# Patient Record
Sex: Male | Born: 1935 | Race: White | Hispanic: No | Marital: Single | Smoking: Former smoker
Health system: Southern US, Community
[De-identification: ages and names within clinical notes are randomized; demographics above are authoritative.]

## PROBLEM LIST (undated history)

## (undated) DIAGNOSIS — K219 Gastro-esophageal reflux disease without esophagitis: Secondary | ICD-10-CM

## (undated) DIAGNOSIS — Z87442 Personal history of urinary calculi: Secondary | ICD-10-CM

## (undated) DIAGNOSIS — F329 Major depressive disorder, single episode, unspecified: Secondary | ICD-10-CM

## (undated) DIAGNOSIS — I10 Essential (primary) hypertension: Secondary | ICD-10-CM

## (undated) DIAGNOSIS — H10409 Unspecified chronic conjunctivitis, unspecified eye: Secondary | ICD-10-CM

## (undated) DIAGNOSIS — M1990 Unspecified osteoarthritis, unspecified site: Secondary | ICD-10-CM

## (undated) DIAGNOSIS — F32A Depression, unspecified: Secondary | ICD-10-CM

## (undated) HISTORY — PX: KIDNEY STONE SURGERY: SHX686

## (undated) HISTORY — PX: COLONOSCOPY: SHX174

## (undated) HISTORY — DX: Unspecified osteoarthritis, unspecified site: M19.90

---
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
## 1999-05-10 ENCOUNTER — Ambulatory Visit (HOSPITAL_COMMUNITY): Admission: RE | Admit: 1999-05-10 | Discharge: 1999-05-10 | Payer: Self-pay | Admitting: Urology

## 1999-05-10 ENCOUNTER — Encounter: Payer: Self-pay | Admitting: Urology

## 1999-10-01 ENCOUNTER — Encounter (INDEPENDENT_AMBULATORY_CARE_PROVIDER_SITE_OTHER): Payer: Self-pay

## 1999-10-01 ENCOUNTER — Ambulatory Visit (HOSPITAL_COMMUNITY): Admission: RE | Admit: 1999-10-01 | Discharge: 1999-10-01 | Payer: Self-pay | Admitting: *Deleted

## 2002-02-13 ENCOUNTER — Encounter: Payer: Self-pay | Admitting: Emergency Medicine

## 2002-02-14 ENCOUNTER — Inpatient Hospital Stay (HOSPITAL_COMMUNITY): Admission: EM | Admit: 2002-02-14 | Discharge: 2002-02-14 | Payer: Self-pay | Admitting: Emergency Medicine

## 2004-05-15 ENCOUNTER — Encounter (INDEPENDENT_AMBULATORY_CARE_PROVIDER_SITE_OTHER): Payer: Self-pay | Admitting: *Deleted

## 2004-05-15 ENCOUNTER — Ambulatory Visit (HOSPITAL_COMMUNITY): Admission: RE | Admit: 2004-05-15 | Discharge: 2004-05-15 | Payer: Self-pay | Admitting: *Deleted

## 2004-09-22 ENCOUNTER — Emergency Department (HOSPITAL_COMMUNITY): Admission: EM | Admit: 2004-09-22 | Discharge: 2004-09-22 | Payer: Self-pay | Admitting: Emergency Medicine

## 2005-08-21 ENCOUNTER — Encounter: Admission: RE | Admit: 2005-08-21 | Discharge: 2005-08-21 | Payer: Self-pay | Admitting: Internal Medicine

## 2005-09-23 ENCOUNTER — Ambulatory Visit (HOSPITAL_COMMUNITY): Admission: RE | Admit: 2005-09-23 | Discharge: 2005-09-23 | Payer: Self-pay | Admitting: Urology

## 2005-10-01 ENCOUNTER — Ambulatory Visit (HOSPITAL_BASED_OUTPATIENT_CLINIC_OR_DEPARTMENT_OTHER): Admission: RE | Admit: 2005-10-01 | Discharge: 2005-10-01 | Payer: Self-pay | Admitting: Urology

## 2006-06-18 ENCOUNTER — Encounter: Admission: RE | Admit: 2006-06-18 | Discharge: 2006-06-18 | Payer: Self-pay | Admitting: Internal Medicine

## 2006-08-04 ENCOUNTER — Ambulatory Visit (HOSPITAL_COMMUNITY): Admission: RE | Admit: 2006-08-04 | Discharge: 2006-08-04 | Payer: Self-pay | Admitting: Urology

## 2006-09-15 ENCOUNTER — Emergency Department (HOSPITAL_COMMUNITY): Admission: EM | Admit: 2006-09-15 | Discharge: 2006-09-15 | Payer: Self-pay | Admitting: Emergency Medicine

## 2008-05-09 ENCOUNTER — Emergency Department (HOSPITAL_COMMUNITY): Admission: EM | Admit: 2008-05-09 | Discharge: 2008-05-09 | Payer: Self-pay | Admitting: Emergency Medicine

## 2008-08-05 ENCOUNTER — Ambulatory Visit (HOSPITAL_BASED_OUTPATIENT_CLINIC_OR_DEPARTMENT_OTHER): Admission: RE | Admit: 2008-08-05 | Discharge: 2008-08-05 | Payer: Self-pay | Admitting: Urology

## 2008-11-03 ENCOUNTER — Encounter: Admission: RE | Admit: 2008-11-03 | Discharge: 2008-11-03 | Payer: Self-pay | Admitting: Internal Medicine

## 2008-11-10 ENCOUNTER — Encounter: Admission: RE | Admit: 2008-11-10 | Discharge: 2009-02-08 | Payer: Self-pay | Admitting: Internal Medicine

## 2009-12-25 ENCOUNTER — Encounter: Admission: RE | Admit: 2009-12-25 | Discharge: 2009-12-25 | Payer: Self-pay | Admitting: Internal Medicine

## 2010-09-25 LAB — POCT I-STAT, CHEM 8
Chloride: 103 mEq/L (ref 96–112)
Creatinine, Ser: 0.8 mg/dL (ref 0.4–1.5)
HCT: 47 % (ref 39.0–52.0)
Potassium: 4.1 mEq/L (ref 3.5–5.1)
Sodium: 138 mEq/L (ref 135–145)

## 2010-10-23 NOTE — Op Note (Signed)
NAMEJERRON, Roger Saunders NO.:  [id number]   MEDICAL RECORD NO.:  [id number]          PATIENT TYPE:  AMB   LOCATION:  NESC                         FACILITY:  [facility]   PHYSICIAN:  Maretta Bees. Vonita Moss, M.D.DATE OF BIRTH:  1932/01/11   DATE OF PROCEDURE:  08/05/2008  DATE OF DISCHARGE:                               OPERATIVE REPORT   PREOPERATIVE DIAGNOSIS:  Bladder stone.   POSTOPERATIVE DIAGNOSIS:  Bladder stone.   PROCEDURE:  Cystoscopy and electrohydraulic lithotripsy of bladder  stone.   SURGEON:  Dr. Larey Dresser.   ANESTHESIA:  General.   INDICATIONS:  This gentleman recently passed a left ureteral calculus  that measured 6 mm in size.  He never did pass it from the bladder.  He  came in the other day complaining about dysuria and intermittent  straining to void, and the KUB demonstrated that the stone has remained  in the bladder after several weeks.  Because of the symptomatic nature  of the stone, he was elected to have cysto and EHL.   PROCEDURE:  The patient was brought to the operating room and placed in  lithotomy position.  External genitalia were prepped and draped in usual  fashion.  He was cystoscoped, and the anterior urethra was normal.  He  had partial prostatic obstruction with actually fairly small lateral  lobes but a high bladder neck.  The bladder had trabeculation.  He has  some increased erythema felt to be secondary to this golden yellow  irregular stone in the bladder.  It was too large to pass through the  cystoscope in one piece, so I used the EHL and broke it into several  small fragments that were then washed out of the bladder.  At this  point, all the stone fragments were removed.  There was no bleeding, and  he had essentially no blood loss, and the bladder was drained.  The  cystoscope removed, and he was taken to recovery room in good condition,  having tolerated the procedure well.      Maretta Bees. Vonita Moss, M.D.  Electronically Signed     LJP/MEDQ  D:  08/05/2008  T:  08/05/2008  Job:  [id number]

## 2010-10-26 NOTE — Discharge Summary (Signed)
   NAME:  Roger Saunders, Roger Saunders                          ACCOUNT NO.:  [id number]   MEDICAL RECORD NO.:  [id number]                   PATIENT TYPE:  INP   LOCATION:  0355                                 FACILITY:  [facility]   PHYSICIAN:  Juline Patch, M.D.                  DATE OF BIRTH:  1932-02-05   DATE OF ADMISSION:  02/13/2002  DATE OF DISCHARGE:  02/14/2002                                 DISCHARGE SUMMARY   HISTORY OF PRESENT ILLNESS:  The patient is a 77 year old white male with  multiple risk factors for coronary artery disease.  He came in complaining  of epigastric and central chest pain that lasted for six hours, which was  relieved by nitroglycerin in the emergency room.  He was admitted for chest  pain rule out.   HOSPITAL COURSE:  His hospital course involving three serial CRPs that were  negative, EKGs that show no ST changes.  He did have an elevated systolic  blood pressure in the 190s but it did go down on its own.  He remained chest  pain-free throughout the hospital course.  He was titrated off the  nitroglycerin drip from the emergency room and remained chest pain free for  the rest of his hospital course.   DISCHARGE DIAGNOSES:  1. Atypical chest pain.  2. Esophageal spasm/gastroesophageal reflux disease.  3. Hypertension.  4. Hyperlipidemia.  5. Osteoarthritis.   PAST MEDICAL HISTORY:  The patient had a Cardiolite stress test one year ago  that was negative.   MEDICATIONS ON DISCHARGE:  1. Monopril 10 mg once a day.  2. Lipitor 10 mg once a day.  3. Aspirin 81 mg once a day.  4. Glucosamine 1000 mg once a day.  5. Protonix 40 mg once a day.   DISPOSITION:  Home.   STATUS:  Stable.    FOLLOWUP:  The patient will follow up with Dr. Lendell Caprice in one week's time.  He was told if he has chest pain that recurs that is not relieved with  Protonix he needs to call or directly go to the emergency room as we cannot  definitely say that this is not cardiac until he is  further followed up.                                               Juline Patch, M.D.    RP/MEDQ  D:  02/14/2002  T:  02/14/2002  Job:  [id number]   cc:   Janae Bridgeman. Eloise Harman., M.D.  Fax: [phone]

## 2010-10-26 NOTE — H&P (Signed)
NAME:  Roger Saunders, LEGATE                          ACCOUNT NO.:  [id number]   MEDICAL RECORD NO.:  [id number]                   PATIENT TYPE:  EMS   LOCATION:  ED                                   FACILITY:  [facility]   PHYSICIAN:  Juline Patch, M.D.                  DATE OF BIRTH:  1932-03-23   DATE OF ADMISSION:  02/13/2002  DATE OF DISCHARGE:                                HISTORY & PHYSICAL   HISTORY OF PRESENT ILLNESS:  Mr. Roger Saunders is a 77 year old white male with  multiple risk factors for coronary artery such as family history, smoker,  hypertension, hyperlipidemia, who came in complaining of indigestion like  pain in the central chest area at 4:00 p.m. while lying on the couch. The  patient took Tums with no relief and waited it out until after dinner, when  the pain increased to a six out of ten. He took Tums with still no relief  and decided to come to the emergency room. Involves having a treadmill as  well as Cardiolite stress test at [facility] approximately one year ago  for complaints of numbness in the arms. The stress test was negative. He  exercises regularly and is outside working in the yard and has never had  chest pain or shortness of breath during exertion. In the emergency room, he  was given Nitroglycerin times three which improved his chest pain. His blood  pressure was elevated in the systolic of 190's. He had no EKG changes and  was started on Nitro drip. The emergency room would like for the patient to  be admitted.   PAST MEDICAL HISTORY:  The patient is followed by Dr. Lendell Caprice who had  recently seen in approximately one month ago, where he had normal  cholesterol secondary to being on Lipitor. The patient has a history of  hypertension and is on Monopril. Also has osteoarthritis and kidney stones.   MEDICATIONS:  1. Monopril 10 mg once a day.  2. Lipitor 10 mg once a day.  3. Aspirin.  4. Glucosamine 1,000 mg once a day.   ALLERGIES:  No known drug  allergies.   SOCIAL HISTORY:  He smokes cigars, five to six per day. Drinks three to four  glasses of wine a week.   FAMILY HISTORY:  Brother had early coronary artery disease, status post  coronary artery bypass grafting.   REVIEW OF SYSTEMS:  Denies any weight loss, fever, chills, cough, shortness  of breath, nausea and vomiting, headache, rash, blood in stools, black  stools, epigastric pain. Does admit to having indigestion once in a while,  relieved by Tums. All other review of systems negative.   PHYSICAL EXAMINATION:  GENERAL: The patient is comfortable in bed with  Nitroglycerin drip running.  VITAL SIGNS: Blood pressure 132/70, pulse 72 after being on the Nitro drip.  The systolic was higher when he first came  in.  HEENT: Pupils are equal, round, and reactive to light and accommodation.  Throat clear. Mucous membranes wet.  NECK: No lymphadenopathy. Supple.  LUNGS: Clear to auscultation and percussion.  HEART: S1 and S2.  ABDOMEN: Soft and nontender.  EXTREMITIES: No edema.  NEURO: Mood is appropriate.   DIAGNOSTIC STUDIES:  Chest x-ray shows no disease. EKG shows sinus rhythm  and no acute ST changes.   LABORATORY DATA:  Negative CK, MB, and troponin. This is after six hours of  chest pain. Other lab workup is negative.   ASSESSMENT/PLAN:  The patient is a 77 year old white male with multiple risk  factors for coronary artery disease and negative Cardiolite stress test one  year ago, who comes in complaining of six hours of chest pain with no EKG  changes and negative troponin. He describes the pain as being an indigestion-  like pain but more severe in nature which warranted for him to go to the  emergency room. This was relieved by Nitroglycerin. He is going to be  admitted to telemetry and he will be ruled out with enzymes. Will continue  the Nitro drip. My suspicion is that this is not cardiac in nature,  considering his history and examination as well as a  negative troponin after  six hours of chest pain. I will hold off on anticoagulation and just  continue him on Nitroglycerin drip, aspirin, and Lipitor. Because of his  high blood pressure I will start him on low dose beta blockers.                                               Juline Patch, M.D.    RP/MEDQ  D:  02/14/2002  T:  02/14/2002  Job:  [id number]   cc:   Janae Bridgeman. Eloise Harman., M.D.  Fax: [phone]

## 2010-10-26 NOTE — Op Note (Signed)
Roger Saunders, Roger Saunders                ACCOUNT NO.:  [id number]   MEDICAL RECORD NO.:  [id number]          PATIENT TYPE:  AMB   LOCATION:  NESC                         FACILITY:  [facility]   PHYSICIAN:  Maretta Bees. Vonita Moss, M.D.DATE OF BIRTH:  Jun 11, 1931   DATE OF PROCEDURE:  10/01/2005  DATE OF DISCHARGE:                                 OPERATIVE REPORT   PREOPERATIVE DIAGNOSIS:  Bladder stone.   POSTOPERATIVE DIAGNOSIS:  Bladder stone.   PROCEDURE:  Cystoscopy and electrohydraulic lithotripsy of bladder stone.   SURGEON:  Maretta Bees. Vonita Moss, M.D.   ANESTHESIA:  General.   INDICATIONS:  This gentleman with a long history of stone disease had a 7 mm  stone in the left UVJ, and he was scheduled for ESL, but on the day of ESL,  the KUB showed the stone was in the bladder.  I put him on Flomax, and in  the last week, he has been unable to pass this stone spontaneously, so he is  brought to the OR today for cysto and EHL.   PROCEDURE:  The patient is brought to the operating room and placed in a  lithotomy position.  External genitalia were prepped and draped in the usual  fashion.  He was cystoscoped.  The anterior prostatic urethra was  unremarkable.  The bladder had minimal trabeculation.  No tumors or  inflammatory lesions.  He did have a golden, round stone found on x-ray.  It  was too big to come out the cystoscope, so he had the stone fragmented with  EHL under multiple small pieces, which then were washed out through the  cystoscope without problem.  At this point, the bladder was emptied, the  scope removed.  The patient was sent to the recovery room in good condition.  There was no blood loss.  He tolerated the procedure well.      Maretta Bees. Vonita Moss, M.D.  Electronically Signed     LJP/MEDQ  D:  10/01/2005  T:  10/01/2005  Job:  [id number]

## 2010-10-26 NOTE — Op Note (Signed)
NAMELOVE, MILBOURNE NO.:  [id number]   MEDICAL RECORD NO.:  [id number]          PATIENT TYPE:  AMB   LOCATION:  ENDO                         FACILITY:  [facility]   PHYSICIAN:  Georgiana Spinner, M.D.    DATE OF BIRTH:  09-08-1931   DATE OF PROCEDURE:  05/15/2004  DATE OF DISCHARGE:                                 OPERATIVE REPORT   PROCEDURE PERFORMED:  Colonoscopy.   ENDOSCOPIST:  Georgiana Spinner, M.D.   INDICATIONS FOR PROCEDURE:  Colon polyp.   ANESTHESIA:  Demerol 20 mg, Versed 0 mg.   DESCRIPTION OF PROCEDURE:  With the patient mildly sedated in the left  lateral decubitus position, the Olympus videoscopic colonoscope was inserted  in the rectum and passed under direct vision to the cecum, identified by the  ileocecal valve and appendiceal orifice, both of which were photographed.  From this point the colonoscope was slowly withdrawn taking circumferential  views of the colonic mucosa stopping in the sigmoid colon where moderate  degree of diverticula were noted at 30 cm from the anal verge at which  point, a small polyp was seen and removed using hot biopsy forceps technique  setting of 20/200 blended current.  The endoscope was straightened and  withdrawn all the way to the rectum which appeared normal on direct and  retroflex view.  The endoscope was straightened and withdrawn.  The  patient's vital signs and pulse oximeter remained stable.  The patient  tolerated the procedure well without apparent complications.   FINDINGS:  Diverticulosis of the sigmoid colon.  Small polyp at 30 cm from  the anal verge.  Otherwise unremarkable examination.   PLAN:  Await biopsy report.  The patient will call me for results and follow  up with me as an outpatient.       GMO/MEDQ  D:  05/15/2004  T:  05/15/2004  Job:  [id number]

## 2010-10-26 NOTE — Op Note (Signed)
[name], Roger Saunders NO.:  [id number]   MEDICAL RECORD NO.:  [id number]          PATIENT TYPE:  AMB   LOCATION:  ENDO                         FACILITY:  [facility]   PHYSICIAN:  Georgiana Spinner, M.D.    DATE OF BIRTH:  28-Dec-1931   DATE OF PROCEDURE:  05/15/2004  DATE OF DISCHARGE:                                 OPERATIVE REPORT   PROCEDURE PERFORMED:  Upper endoscopy with biopsy.   ENDOSCOPIST:  Georgiana Spinner, M.D.   INDICATIONS FOR PROCEDURE:  Gastroesophageal reflux disease.   ANESTHESIA:  Demerol 50 mg, Versed 5 mg.   DESCRIPTION OF PROCEDURE:  With the patient mildly sedated in the left  lateral decubitus position, the Olympus videoscopic endoscope was inserted  in the mouth and passed under direct vision through the esophagus into the  stomach.  The fundus, body, antrum, duodenal bulb and second portion of the  duodenum were visualized.  From this point, the endoscope was slowly  withdrawn taking circumferential views of the duodenal mucosa until the  endoscope was pulled back into the stomach and placed on retroflexion to  view the stomach from below.  The endoscope was then straightened and  withdrawn taking circumferential views of the remaining gastric and  esophageal mucosa, stopping in the distal esophagus to photograph and biopsy  around the perimeter of the squamocolumnar junction to rule out Barrett's.  The endoscope was withdrawn.  The patient's vital signs and pulse oximeter  remained stable.  The patient tolerated the procedure well without apparent  complications.   FINDINGS:  Unremarkable examination.   PLAN:  Await biopsy report.  The patient will call me for results and follow  up with me as an outpatient.  Proceed to colonoscopy as planned.       GMO/MEDQ  D:  05/15/2004  T:  05/15/2004  Job:  [id number]

## 2011-03-12 LAB — URINE MICROSCOPIC-ADD ON

## 2011-03-12 LAB — URINALYSIS, ROUTINE W REFLEX MICROSCOPIC
Nitrite: NEGATIVE
Specific Gravity, Urine: 1.014
pH: 7

## 2014-06-24 ENCOUNTER — Encounter: Payer: Self-pay | Admitting: Rehabilitative and Restorative Service Providers"

## 2014-07-04 ENCOUNTER — Ambulatory Visit: Payer: Self-pay | Admitting: Physical Therapy

## 2014-07-04 ENCOUNTER — Ambulatory Visit
Payer: Commercial Managed Care - HMO | Attending: Internal Medicine | Admitting: Rehabilitative and Restorative Service Providers"

## 2014-07-04 ENCOUNTER — Encounter: Payer: Self-pay | Admitting: Rehabilitative and Restorative Service Providers"

## 2014-07-04 DIAGNOSIS — H8110 Benign paroxysmal vertigo, unspecified ear: Secondary | ICD-10-CM | POA: Diagnosis not present

## 2014-07-04 NOTE — Therapy (Signed)
[facility] [address] [place], [place], [address] Phone: [phone]   Fax:  [phone]  Physical Therapy Evaluation  Patient Details  Name: Roger Saunders MRN: [id number] Date of Birth: 28-Apr-1932 Referring Provider:  Jani Gravel, MD  Encounter Date: 07/04/2014      PT End of Session - 07/04/14 1352    Visit Number 1  G code 1   Number of Visits 4   Date for PT Re-Evaluation 08/04/14   PT Start Time 1320   PT Stop Time 1345   PT Time Calculation (min) 25 min   Activity Tolerance Patient tolerated treatment well      Past Medical History  Diagnosis Date  . Arthritis     No past surgical history on file.  There were no vitals taken for this visit.  Visit Diagnosis:  BPPV (benign paroxysmal positional vertigo), unspecified laterality      Subjective Assessment - 07/04/14 1324    Symptoms The patient reports h/o vertigo and was treated at our clinic in the past.  He reports occasional conjunctivitis and noted when trying to use eye drops that the world was spinning.  He is noticing dizziness with getting up in the morning.  He did exercises that we prescribed in the past  and noted no improvement in 4-5 days, so stopped doing exercise.    Patient Stated Goals decrease dizziness   Currently in Pain? No/denies          [facility] PT Assessment - 07/04/14 1327    Assessment   Medical Diagnosis vertigo   Onset Date --  05/2014   Prior Therapy OP vestibular rehab x >5 years ago   Balance Screen   Has the patient fallen in the past 6 months No   Has the patient had a decrease in activity level because of a fear of falling?  Yes   Is the patient reluctant to leave their home because of a fear of falling?  No   Home Environment   Living Enviornment Private residence   Prior Function   Level of Independence Independent with basic ADLs;Independent with homemaking with ambulation   Vocation Part time employment    Vocation Requirements --  driving   Observation/Other Assessments   Focus on Therapeutic Outcomes (FOTO)  73   Other Surveys  --  DHI=24%            Vestibular Assessment - 07/04/14 1332    Type of Dizziness Spinning  "dull feeling"   Frequency of Dizziness --  daily   Duration of Dizziness --  seconds, varies in intensity   Aggravating Factors Mornings;Supine to sit;Looking up to the ceiling   Relieving Factors Head stationary   Occulomotor Alignment Normal   Spontaneous Absent   Gaze-induced Absent   Smooth Pursuits Intact   Saccades Intact   VOR 1 Head Only (x 1 viewing) --  slow gaze, patient able to maintain fixation   Dix-Hallpike Dix-Hallpike Right;Dix-Hallpike Left   Sidelying Test Sidelying Right;Sidelying Left   Horizontal Canal Testing Horizontal Canal Right;Horizontal Canal Left   Dix-Hallpike Right Symptoms No nystagmus   Dix-Hallpike Left Symptoms No nystagmus   Sidelying Right Symptoms No nystagmus   Sidelying Left Symptoms No nystagmus   Horizontal Canal Right Symptoms Normal   Horizontal Canal Left Symptoms Normal            PT Education - 07/04/14 1351    Education provided Yes   Education Details Reviewed  technique of brandt daroff habituation as patient was not sure if performing correctly.   Person(s) Educated Patient   Methods Explanation;Demonstration   Comprehension Verbalized understanding             PT Long Term Goals - 2014/08/03 1356    PT LONG TERM GOAL #1   Title The patient will be indep with self management of BPPV. Target date 08/03/2014.   Time 4   Period Weeks   PT LONG TERM GOAL #2   Title The patient will improve DHI from 24% to <or equal to 15%. Target date 2014-08-03.   Time 4   Period Weeks               Plan - Aug 03, 2014 1352    Clinical Impression Statement The patient is an 81 yo male known to our clinic from priot vestibular rehab years ago when he had BPPV.  He had a recurrence of symptoms in  05/2014 and presents today with reports of positonal vertigo worse with looking up, and getting out of bed.  The patient's symptoms were not provoked today during positional testing.  PT recommended we reschedule to early morning appt as symptoms may be more prevalent, however he prefers to call back if dizziness does not resolve in the next 1-2 weeks (he noted it was improving this week).   Pt will benefit from skilled therapeutic intervention in order to improve on the following deficits Other (comment)  impaired transitional movements, bed mobility and head movements   Rehab Potential Good   PT Frequency 1x / week   PT Duration 4 weeks   PT Treatment/Interventions Therapeutic activities;Patient/family education;Neuromuscular re-education;Therapeutic exercise;Balance training;Gait training;Other (comment)  canolith repositioning if indicated   PT Next Visit Plan Recheck for BPPV, treat with canolith repositioning and review prior HEP.   Consulted and Agree with Plan of Care Patient          G-Codes - 08/03/2014 1357    Functional Assessment Tool Used BPPV, positional testing negative at today's evaluation.   Functional Limitation Self care   Self Care Current Status [phone]) At least 20 percent but less than 40 percent impaired, limited or restricted   Self Care Goal Status (G2836) At least 1 percent but less than 20 percent impaired, limited or restricted       Problem List There are no active problems to display for this patient.  Thank you for the referral of this patient.   [place], [place] 03-Aug-2014, 1:58 PM  [facility] [address] [place], [place], [address] Phone: [phone]   Fax:  [phone]

## 2014-08-30 ENCOUNTER — Encounter: Payer: Self-pay | Admitting: Rehabilitative and Restorative Service Providers"

## 2014-08-30 NOTE — Therapy (Signed)
[facility] [address] [place], [place], [address] Phone: [phone]   Fax:  [phone]  Patient Details  Name: [name] MRN: [id number] Date of Birth: 1931/07/01 Referring Provider:  No ref. provider found  Encounter Date: 08/30/2014  PHYSICAL THERAPY DISCHARGE SUMMARY  Visits from Start of Care: eval only  Current functional level related to goals / functional outcomes: See initial evaluation- goals not reassessed.   Remaining deficits: See initial eval.   Education / Equipment: n/a  Plan: Patient agrees to discharge.  Patient goals were not met. Patient is being discharged due to not returning since the last visit.  ?????   He was reporting continued improvement in symptoms and planned to call back only if further treatment indicated. Thank you for the referral of this patient.    Gracen Ringwald 08/30/2014, 9:08 AM  [facility] [address] [place], [place], [address] Phone: [phone]   Fax:  [phone]

## 2015-11-29 ENCOUNTER — Encounter (HOSPITAL_BASED_OUTPATIENT_CLINIC_OR_DEPARTMENT_OTHER): Payer: Self-pay

## 2015-11-29 ENCOUNTER — Emergency Department (HOSPITAL_BASED_OUTPATIENT_CLINIC_OR_DEPARTMENT_OTHER): Payer: Commercial Managed Care - HMO

## 2015-11-29 ENCOUNTER — Emergency Department (HOSPITAL_BASED_OUTPATIENT_CLINIC_OR_DEPARTMENT_OTHER)
Admission: EM | Admit: 2015-11-29 | Discharge: 2015-11-29 | Disposition: A | Discharge status: Home / Self Care | Payer: Commercial Managed Care - HMO | Attending: Emergency Medicine | Admitting: Emergency Medicine

## 2015-11-29 ENCOUNTER — Other Ambulatory Visit: Payer: Self-pay

## 2015-11-29 DIAGNOSIS — I10 Essential (primary) hypertension: Secondary | ICD-10-CM | POA: Insufficient documentation

## 2015-11-29 DIAGNOSIS — M1990 Unspecified osteoarthritis, unspecified site: Secondary | ICD-10-CM | POA: Diagnosis not present

## 2015-11-29 DIAGNOSIS — R531 Weakness: Secondary | ICD-10-CM | POA: Diagnosis present

## 2015-11-29 DIAGNOSIS — Z79899 Other long term (current) drug therapy: Secondary | ICD-10-CM | POA: Diagnosis not present

## 2015-11-29 DIAGNOSIS — F410 Panic disorder [episodic paroxysmal anxiety] without agoraphobia: Secondary | ICD-10-CM

## 2015-11-29 HISTORY — DX: Essential (primary) hypertension: I10

## 2015-11-29 LAB — CBC WITH DIFFERENTIAL/PLATELET
Basophils Absolute: 0 10*3/uL (ref 0.0–0.1)
Basophils Relative: 0 %
EOS PCT: 3 %
Eosinophils Absolute: 0.1 10*3/uL (ref 0.0–0.7)
HEMATOCRIT: 42.7 % (ref 39.0–52.0)
Hemoglobin: 14.6 g/dL (ref 13.0–17.0)
LYMPHS ABS: 2.2 10*3/uL (ref 0.7–4.0)
LYMPHS PCT: 40 %
MCH: 33 pg (ref 26.0–34.0)
MCHC: 34.2 g/dL (ref 30.0–36.0)
MCV: 96.6 fL (ref 78.0–100.0)
MONO ABS: 0.5 10*3/uL (ref 0.1–1.0)
Monocytes Relative: 9 %
Neutro Abs: 2.7 10*3/uL (ref 1.7–7.7)
Neutrophils Relative %: 48 %
PLATELETS: 148 10*3/uL — AB (ref 150–400)
RBC: 4.42 MIL/uL (ref 4.22–5.81)
RDW: 11.7 % (ref 11.5–15.5)
WBC: 5.5 10*3/uL (ref 4.0–10.5)

## 2015-11-29 LAB — COMPREHENSIVE METABOLIC PANEL
ALT: 21 U/L (ref 17–63)
AST: 19 U/L (ref 15–41)
Albumin: 4.1 g/dL (ref 3.5–5.0)
Alkaline Phosphatase: 44 U/L (ref 38–126)
Anion gap: 7 (ref 5–15)
BILIRUBIN TOTAL: 0.7 mg/dL (ref 0.3–1.2)
BUN: 14 mg/dL (ref 6–20)
CHLORIDE: 106 mmol/L (ref 101–111)
CO2: 25 mmol/L (ref 22–32)
CREATININE: 0.84 mg/dL (ref 0.61–1.24)
Calcium: 8.9 mg/dL (ref 8.9–10.3)
Glucose, Bld: 107 mg/dL — ABNORMAL HIGH (ref 65–99)
Potassium: 3.9 mmol/L (ref 3.5–5.1)
Sodium: 138 mmol/L (ref 135–145)
TOTAL PROTEIN: 6.8 g/dL (ref 6.5–8.1)

## 2015-11-29 LAB — URINALYSIS, ROUTINE W REFLEX MICROSCOPIC
BILIRUBIN URINE: NEGATIVE
GLUCOSE, UA: NEGATIVE mg/dL
HGB URINE DIPSTICK: NEGATIVE
KETONES UR: NEGATIVE mg/dL
Leukocytes, UA: NEGATIVE
Nitrite: NEGATIVE
PROTEIN: NEGATIVE mg/dL
Specific Gravity, Urine: 1.012 (ref 1.005–1.030)
pH: 6.5 (ref 5.0–8.0)

## 2015-11-29 LAB — TROPONIN I

## 2015-11-29 MED ORDER — LORAZEPAM 1 MG PO TABS
1.0000 mg | ORAL_TABLET | Freq: Once | ORAL | Status: AC
  Administered 2015-11-29: 1 mg via ORAL
  Filled 2015-11-29: qty 1

## 2015-11-29 NOTE — ED Provider Notes (Signed)
TIME SEEN: 4:00 AM  CHIEF COMPLAINT: "I just feel off"  HPI: Pt is a 82 y.o. male with history of hypertension, arthritis who presents to the emergency department with not feeling well. States he woke up just prior to arrival and felt tense, anxious. States he felt very worried and not well. Denies any headache, chest pain or chest discomfort, shortness of breath, palpitations, recent fevers, cough, vomiting or diarrhea, bloody stools or melena, dysuria or hematuria. No numbness, tingling or focal weakness. No head injury. Complains of feeling like his mouth is dry. He is on amlodipine and losartan but takes no other medications. No changes in the doses of these medications. States he has had an anxiety attack once in the past and states this feels somewhat similar. Patient is still very active and is still working for the [employer]. He is an active traveler and states he spent 3 weeks in [place] in April 2017. No other recent travel. No tick bite.  ROS: See HPI Constitutional: no fever  Eyes: no drainage  ENT: no runny nose   Cardiovascular:  no chest pain  Resp: no SOB  GI: no vomiting GU: no dysuria Integumentary: no rash  Allergy: no hives  Musculoskeletal: no leg swelling  Neurological: no slurred speech ROS otherwise negative  PAST MEDICAL HISTORY/PAST SURGICAL HISTORY:  Past Medical History  Diagnosis Date  . Arthritis   . Hypertension   . Kidney stones     MEDICATIONS:  Prior to Admission medications   Medication Sig Start Date End Date Taking? Authorizing Provider  amLODipine (NORVASC) 5 MG tablet Take 5 mg by mouth daily.   Yes Historical Provider, MD  losartan (COZAAR) 100 MG tablet Take 100 mg by mouth daily.   Yes Historical Provider, MD    ALLERGIES:  Not on File  SOCIAL HISTORY:  Social History  Substance Use Topics  . Smoking status: Never Smoker   . Smokeless tobacco: Not on file  . Alcohol Use: Not on file    FAMILY HISTORY: No family history on  file.  EXAM: BP 144/81 mmHg  Pulse 71  Temp(Src) 97.9 F (36.6 C) (Oral)  Resp 18  Ht 5\' 10"  (1.778 m)  Wt 200 lb (90.719 kg)  BMI 28.70 kg/m2  SpO2 96% CONSTITUTIONAL: Alert and oriented 3 and responds appropriately to questions. Well-appearing; well-nourished, Elderly but appears younger than stated age, afebrile and in no distress, nontoxic, well-hydrated HEAD: Normocephalic EYES: Conjunctivae clear, PERRL ENT: normal nose; no rhinorrhea; moist mucous membranes NECK: Supple, no meningismus, no LAD  CARD: RRR; S1 and S2 appreciated; no murmurs, no clicks, no rubs, no gallops RESP: Normal chest excursion without splinting or tachypnea; breath sounds clear and equal bilaterally; no wheezes, no rhonchi, no rales, no hypoxia or respiratory distress, speaking full sentences ABD/GI: Normal bowel sounds; non-distended; soft, non-tender, no rebound, no guarding, no peritoneal signs BACK:  The back appears normal and is non-tender to palpation, there is no CVA tenderness, no midline spinal tenderness or step-off or deformity EXT: Normal ROM in all joints; non-tender to palpation; no edema; normal capillary refill; no cyanosis, no calf tenderness or swelling, 2+ DP pulses and radial pulses bilaterally    SKIN: Normal color for age and race; warm; no rash NEURO: Moves all extremities equally, sensation to light touch intact diffusely, cranial nerves II through XII intact, strength 5/5 in all 4 extremities, normal gait PSYCH: The patient's mood and manner are appropriate. Grooming and personal hygiene are appropriate.  MEDICAL DECISION  MAKING: Patient here with what may be an anxiety attack. He has nonfocal symptoms. Exam is otherwise completely benign. No chest pain or shortness of breath. No infectious symptoms. Neurologically intact. We'll give Ativan for his symptoms and reassess. Family at bedside. He history knee without difficulty. We'll obtain basic labs, EKG, urinalysis to ensure there is  no other organic cause for his symptoms.  ED PROGRESS: Patient's labs are unremarkable. Negative troponin. Urine shows no sign of infection, no ketones. Chest x-ray clear. EKG shows no interval changes, arrhythmia or ischemic abnormality. He reports feeling much better after Ativan. Suspect panic attack. I feel he is safe for discharge. He states he is ready to go home. His PCP is Dr. Maudie Mercury. Have recommended outpatient follow-up. Discussed return precautions with patient and his family at bedside. They're comfortable with this plan.   At this time, I do not feel there is any life-threatening condition present. I have reviewed and discussed all results (EKG, imaging, lab, urine as appropriate), exam findings with patient. I have reviewed nursing notes and appropriate previous records.  I feel the patient is safe to be discharged home without further emergent workup. Discussed usual and customary return precautions. Patient and family (if present) verbalize understanding and are comfortable with this plan.  Patient will follow-up with their primary care provider. If they do not have a primary care provider, information for follow-up has been provided to them. All questions have been answered.     EKG Interpretation  Date/Time:  Wednesday November 29 2015 04:27:20 EDT Ventricular Rate:  61 PR Interval:    QRS Duration: 103 QT Interval:  420 QTC Calculation: 423 R Axis:   26 Text Interpretation:  Sinus rhythm No significant change since 2000 Confirmed by Jodel Mayhall,  DO, Yamil Oelke [phone]) on 11/29/2015 4:30:10 AM         Delice Bison Tyresa Prindiville, DO 11/29/15 0510

## 2015-11-29 NOTE — ED Notes (Signed)
MD at bedside. 

## 2015-11-29 NOTE — ED Notes (Signed)
Pt ambulated to restroom without assistance.

## 2015-11-29 NOTE — ED Notes (Signed)
Pt woke from sleeping tonight and states he feels "weird."  He c/o mild bilateral lower leg pain, dry mouth and generalized weakness.  No chest pain, normal neurological exam, no numbness, c/o subjective SOB.  Pt is alert and oriented, general complaint of "feeling off."

## 2015-11-29 NOTE — ED Notes (Signed)
Pt c/o general "not well" feeling.  Apparently, this woke patient and worried him enough to come into the hospital this morning.  Neurological exam is normal, in fact pt is able to recall the physician who performed his appendectomy when he was 82 years old, as well as answer all questions appropriately and timely and perform tasks correctly and timely.  Pt denies chest pain, denies headache, c/o subjective SOB, is c/o dry mouth as well.

## 2015-11-29 NOTE — ED Notes (Signed)
Pt verbalizes understanding of d/c instructions and denies any further needs at this time. 

## 2015-11-29 NOTE — Discharge Instructions (Signed)

## 2016-06-24 DIAGNOSIS — R69 Illness, unspecified: Secondary | ICD-10-CM | POA: Diagnosis not present

## 2016-07-01 ENCOUNTER — Emergency Department (HOSPITAL_COMMUNITY): Payer: Medicare HMO

## 2016-07-01 ENCOUNTER — Inpatient Hospital Stay (HOSPITAL_COMMUNITY)
Admission: EM | Admit: 2016-07-01 | Discharge: 2016-07-05 | DRG: 482 | Disposition: A | Discharge status: Home Health | Payer: Medicare HMO | Attending: Family Medicine | Admitting: Family Medicine

## 2016-07-01 ENCOUNTER — Encounter (HOSPITAL_COMMUNITY): Payer: Self-pay | Admitting: Emergency Medicine

## 2016-07-01 DIAGNOSIS — Z8042 Family history of malignant neoplasm of prostate: Secondary | ICD-10-CM | POA: Diagnosis not present

## 2016-07-01 DIAGNOSIS — M818 Other osteoporosis without current pathological fracture: Secondary | ICD-10-CM | POA: Diagnosis present

## 2016-07-01 DIAGNOSIS — T148XXA Other injury of unspecified body region, initial encounter: Secondary | ICD-10-CM | POA: Diagnosis not present

## 2016-07-01 DIAGNOSIS — S72001S Fracture of unspecified part of neck of right femur, sequela: Secondary | ICD-10-CM | POA: Diagnosis not present

## 2016-07-01 DIAGNOSIS — Z87442 Personal history of urinary calculi: Secondary | ICD-10-CM

## 2016-07-01 DIAGNOSIS — M1991 Primary osteoarthritis, unspecified site: Secondary | ICD-10-CM | POA: Diagnosis not present

## 2016-07-01 DIAGNOSIS — Z79899 Other long term (current) drug therapy: Secondary | ICD-10-CM | POA: Diagnosis not present

## 2016-07-01 DIAGNOSIS — Z87891 Personal history of nicotine dependence: Secondary | ICD-10-CM | POA: Diagnosis not present

## 2016-07-01 DIAGNOSIS — D72829 Elevated white blood cell count, unspecified: Secondary | ICD-10-CM | POA: Diagnosis present

## 2016-07-01 DIAGNOSIS — W010XXA Fall on same level from slipping, tripping and stumbling without subsequent striking against object, initial encounter: Secondary | ICD-10-CM | POA: Diagnosis present

## 2016-07-01 DIAGNOSIS — R739 Hyperglycemia, unspecified: Secondary | ICD-10-CM | POA: Diagnosis not present

## 2016-07-01 DIAGNOSIS — Z820 Family history of epilepsy and other diseases of the nervous system: Secondary | ICD-10-CM

## 2016-07-01 DIAGNOSIS — M1990 Unspecified osteoarthritis, unspecified site: Secondary | ICD-10-CM | POA: Diagnosis present

## 2016-07-01 DIAGNOSIS — W19XXXA Unspecified fall, initial encounter: Secondary | ICD-10-CM | POA: Diagnosis not present

## 2016-07-01 DIAGNOSIS — Z8249 Family history of ischemic heart disease and other diseases of the circulatory system: Secondary | ICD-10-CM | POA: Diagnosis not present

## 2016-07-01 DIAGNOSIS — S72051A Unspecified fracture of head of right femur, initial encounter for closed fracture: Principal | ICD-10-CM | POA: Diagnosis present

## 2016-07-01 DIAGNOSIS — Z125 Encounter for screening for malignant neoplasm of prostate: Secondary | ICD-10-CM | POA: Diagnosis not present

## 2016-07-01 DIAGNOSIS — K219 Gastro-esophageal reflux disease without esophagitis: Secondary | ICD-10-CM | POA: Diagnosis present

## 2016-07-01 DIAGNOSIS — Z7982 Long term (current) use of aspirin: Secondary | ICD-10-CM | POA: Diagnosis not present

## 2016-07-01 DIAGNOSIS — I10 Essential (primary) hypertension: Secondary | ICD-10-CM | POA: Diagnosis present

## 2016-07-01 DIAGNOSIS — Z419 Encounter for procedure for purposes other than remedying health state, unspecified: Secondary | ICD-10-CM

## 2016-07-01 DIAGNOSIS — S72001A Fracture of unspecified part of neck of right femur, initial encounter for closed fracture: Secondary | ICD-10-CM | POA: Diagnosis present

## 2016-07-01 DIAGNOSIS — S72001D Fracture of unspecified part of neck of right femur, subsequent encounter for closed fracture with routine healing: Secondary | ICD-10-CM | POA: Diagnosis not present

## 2016-07-01 DIAGNOSIS — R69 Illness, unspecified: Secondary | ICD-10-CM | POA: Diagnosis not present

## 2016-07-01 DIAGNOSIS — Y92410 Unspecified street and highway as the place of occurrence of the external cause: Secondary | ICD-10-CM | POA: Diagnosis not present

## 2016-07-01 DIAGNOSIS — M79604 Pain in right leg: Secondary | ICD-10-CM | POA: Diagnosis not present

## 2016-07-01 DIAGNOSIS — W19XXXD Unspecified fall, subsequent encounter: Secondary | ICD-10-CM | POA: Diagnosis not present

## 2016-07-01 LAB — BASIC METABOLIC PANEL
Anion gap: 10 (ref 5–15)
BUN: 23 mg/dL — ABNORMAL HIGH (ref 6–20)
CO2: 25 mmol/L (ref 22–32)
Calcium: 10 mg/dL (ref 8.9–10.3)
Chloride: 104 mmol/L (ref 101–111)
Creatinine, Ser: 0.91 mg/dL (ref 0.61–1.24)
GFR calc Af Amer: >60 mL/min (ref >60)
GFR calc non Af Amer: >60 mL/min (ref >60)
Glucose, Bld: 117 mg/dL — ABNORMAL HIGH (ref 65–99)
Potassium: 4 mmol/L (ref 3.5–5.1)
Sodium: 139 mmol/L (ref 135–145)

## 2016-07-01 LAB — PROTIME-INR
INR: 0.99
Prothrombin Time: 13.1 seconds (ref 11.4–15.2)

## 2016-07-01 LAB — CBC WITH DIFFERENTIAL/PLATELET
Basophils Absolute: 0 10*3/uL (ref 0.0–0.1)
Basophils Relative: 0 %
Eosinophils Absolute: 0 10*3/uL (ref 0.0–0.7)
Eosinophils Relative: 0 %
HCT: 43.4 % (ref 39.0–52.0)
Hemoglobin: 15.1 g/dL (ref 13.0–17.0)
Lymphocytes Relative: 20 %
Lymphs Abs: 2.5 10*3/uL (ref 0.7–4.0)
MCH: 33.3 pg (ref 26.0–34.0)
MCHC: 34.8 g/dL (ref 30.0–36.0)
MCV: 95.6 fL (ref 78.0–100.0)
Monocytes Absolute: 0.9 10*3/uL (ref 0.1–1.0)
Monocytes Relative: 7 %
Neutro Abs: 9.1 10*3/uL — ABNORMAL HIGH (ref 1.7–7.7)
Neutrophils Relative %: 73 %
Platelets: 158 10*3/uL (ref 150–400)
RBC: 4.54 MIL/uL (ref 4.22–5.81)
RDW: 12.3 % (ref 11.5–15.5)
WBC: 12.7 10*3/uL — ABNORMAL HIGH (ref 4.0–10.5)

## 2016-07-01 MED ORDER — MORPHINE SULFATE (PF) 4 MG/ML IV SOLN
4.0000 mg | Freq: Once | INTRAVENOUS | Status: AC
  Administered 2016-07-01: 4 mg via INTRAVENOUS
  Filled 2016-07-01: qty 1

## 2016-07-01 MED ORDER — PANTOPRAZOLE SODIUM 40 MG PO TBEC: 40.0000 mg | DELAYED_RELEASE_TABLET | Freq: Every day | ORAL | Status: DC | PRN

## 2016-07-01 MED ORDER — SODIUM CHLORIDE 0.9 % IV SOLN
INTRAVENOUS | Status: DC
  Administered 2016-07-01 – 2016-07-02 (×2): via INTRAVENOUS
  Administered 2016-07-02: 1000 mL via INTRAVENOUS

## 2016-07-01 MED ORDER — METHOCARBAMOL 500 MG PO TABS
500.0000 mg | ORAL_TABLET | Freq: Three times a day (TID) | ORAL | Status: DC | PRN
  Administered 2016-07-01 – 2016-07-02 (×2): 500 mg via ORAL
  Filled 2016-07-01 (×2): qty 1

## 2016-07-01 MED ORDER — HYDROMORPHONE HCL 1 MG/ML IJ SOLN
1.0000 mg | Freq: Once | INTRAMUSCULAR | Status: AC
  Administered 2016-07-01: 1 mg via INTRAVENOUS
  Filled 2016-07-01: qty 1

## 2016-07-01 MED ORDER — OXYCODONE-ACETAMINOPHEN 5-325 MG PO TABS
1.0000 | ORAL_TABLET | ORAL | Status: DC | PRN
  Administered 2016-07-01 – 2016-07-02 (×2): 1 via ORAL
  Filled 2016-07-01 (×2): qty 1

## 2016-07-01 MED ORDER — NAPROXEN SODIUM 220 MG PO TABS: 220.0000 mg | ORAL_TABLET | Freq: Two times a day (BID) | ORAL | Status: DC | PRN

## 2016-07-01 MED ORDER — NAPROXEN 250 MG PO TABS
250.0000 mg | ORAL_TABLET | Freq: Two times a day (BID) | ORAL | Status: DC | PRN
  Filled 2016-07-01: qty 1

## 2016-07-01 MED ORDER — SENNA 8.6 MG PO TABS: 1.0000 | ORAL_TABLET | Freq: Every day | ORAL | Status: DC | PRN

## 2016-07-01 MED ORDER — ONDANSETRON HCL 4 MG/2ML IJ SOLN: 4.0000 mg | Freq: Three times a day (TID) | INTRAMUSCULAR | Status: DC | PRN

## 2016-07-01 MED ORDER — AMLODIPINE BESYLATE 5 MG PO TABS
5.0000 mg | ORAL_TABLET | Freq: Every evening | ORAL | Status: DC
  Administered 2016-07-02 – 2016-07-03 (×2): 5 mg via ORAL
  Filled 2016-07-01 (×2): qty 1

## 2016-07-01 MED ORDER — POLYETHYLENE GLYCOL 3350 17 G PO PACK: 17.0000 g | PACK | Freq: Every day | ORAL | Status: DC | PRN

## 2016-07-01 MED ORDER — MORPHINE SULFATE (PF) 2 MG/ML IV SOLN
2.0000 mg | INTRAVENOUS | Status: DC | PRN
  Administered 2016-07-01 – 2016-07-02 (×5): 2 mg via INTRAVENOUS
  Filled 2016-07-01 (×5): qty 1

## 2016-07-01 MED ORDER — LOSARTAN POTASSIUM 50 MG PO TABS
100.0000 mg | ORAL_TABLET | Freq: Every evening | ORAL | Status: DC
  Administered 2016-07-02: 100 mg via ORAL
  Filled 2016-07-01 (×2): qty 2

## 2016-07-01 NOTE — ED Triage Notes (Signed)
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 Pt states that his rt leg "gave out".  No deformity, no neck or back pain, distal PMS good.  Upper rt leg pain.  5/10.

## 2016-07-01 NOTE — ED Provider Notes (Signed)
[facility] DEPT Provider Note   CSN: [id number] Arrival date & time: 07/01/16  1423   History   Chief Complaint Chief Complaint  Patient presents with  . Fall  . Leg Pain    HPI Roger Saunders is a 83 y.o. male.  HPI  83 year old male presents status post hip injury. 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 He reports he landed on the right hip causing significant pain. He was unable to ambulate on his own at that time. Patient denies any history of the same, denies any other injuries from the fall.   Past Medical History:  Diagnosis Date  . Arthritis   . Hypertension   . Kidney stones     Patient Active Problem List   Diagnosis Date Noted  . Closed right hip fracture ([place]) 07/01/2016  . GERD (gastroesophageal reflux disease) 07/01/2016  . Essential hypertension   . Arthritis   . Fall     Past Surgical History:  Procedure Laterality Date  . APPENDECTOMY  1938  . KIDNEY STONE SURGERY       Home Medications    Prior to Admission medications   Medication Sig Start Date End Date Taking? Authorizing Provider  amLODipine (NORVASC) 5 MG tablet Take 5 mg by mouth every evening.    Yes Historical Provider, MD  esomeprazole (NEXIUM) 20 MG packet Take 20 mg by mouth daily as needed (heartburn/acid reflux).   Yes Historical Provider, MD  losartan (COZAAR) 100 MG tablet Take 100 mg by mouth every evening.    Yes Historical Provider, MD  naproxen sodium (ANAPROX) 220 MG tablet Take 220-440 mg by mouth 2 (two) times daily as needed (pain).   Yes Historical Provider, MD    Family History Family History  Problem Relation Age of Onset  . Heart attack Mother   . Prostate cancer Father   . Parkinson's disease Brother     Social History Social History  Substance Use Topics  . Smoking status: Former Smoker    Types: Pipe, Cigars    Quit date: 2007  . Smokeless tobacco: Never  Used  . Alcohol use Yes     Comment: "a litle bit of red wine"     Allergies   Patient has no known allergies.   Review of Systems Review of Systems  All other systems reviewed and are negative.   Physical Exam Updated Vital Signs BP 137/73 (BP Location: Left Arm)   Pulse 79   Temp 98.2 F (36.8 C) (Oral)   Resp 18   SpO2 99%   Physical Exam  Constitutional: He is oriented to person, place, and time. He appears well-developed and well-nourished.  HENT:  Head: Normocephalic and atraumatic.  Eyes: Conjunctivae are normal. Pupils are equal, round, and reactive to light. Right eye exhibits no discharge. Left eye exhibits no discharge. No scleral icterus.  Neck: Normal range of motion. No JVD present. No tracheal deviation present.  Pulmonary/Chest: Effort normal. No stridor.  Musculoskeletal:  Tenderness to palpation of the right lateral hip and proximal femur. Unable to internally or externally rotate femur without significant discomfort. Distal sensation intact.  Neurological: He is alert and oriented to person, place, and time. Coordination normal.  Psychiatric: He has a normal mood and affect. His behavior is normal. Judgment and thought content normal.  Nursing note and vitals reviewed.   ED Treatments / Results  Labs (  all labs ordered are listed, but only abnormal results are displayed) Labs Reviewed  CBC WITH DIFFERENTIAL/PLATELET - Abnormal; Notable for the following:       Result Value   WBC 12.7 (*)    Neutro Abs 9.1 (*)    All other components within normal limits  BASIC METABOLIC PANEL - Abnormal; Notable for the following:    Glucose, Bld 117 (*)    BUN 23 (*)    All other components within normal limits  PROTIME-INR  CBC  BASIC METABOLIC PANEL  TYPE AND SCREEN    EKG  EKG Interpretation  Date/Time:  Monday July 01 2016 21:04:59 EST Ventricular Rate:  76 PR Interval:    QRS Duration: 97 QT Interval:  376 QTC Calculation: 423 R  Axis:   37 Text Interpretation:  Sinus rhythm since last tracing no significant change Confirmed by Eulis Foster  MD, Vira Agar [id number]) on 07/01/2016 9:11:29 PM       Radiology Dg Hip Unilat With Pelvis Min 4 Views Right  Result Date: 07/01/2016 CLINICAL DATA:  Recent fall with right leg pain, initial encounter EXAM: DG HIP (WITH OR WITHOUT PELVIS) 4+V RIGHT COMPARISON:  Films from earlier in the same day FINDINGS: There again seen irregularities along the inferior aspect of the right femoral head suggestive of an impacted fracture in the junction of the femoral head and neck. No other bony abnormality is seen. IMPRESSION: Changes suggestive of impacted fracture in the proximal right femur. Electronically Signed   By: Inez Catalina M.D.   On: 07/01/2016 20:09   Dg Femur, Min 2 Views Right  Result Date: 07/01/2016 CLINICAL DATA:  Slip and fall with right leg pain, initial encounter EXAM: RIGHT FEMUR 2 VIEWS COMPARISON:  03/05/2013 FINDINGS: Irregularity at the junction of the femoral neck and femoral head is noted highly suspicious for an impacted fracture. The remainder of femur is within normal limits. Degenerative changes about the knee joint are seen. No effusion is noted. Vascular calcifications are seen. IMPRESSION: Changes highly suspicious for impacted fracture at the junction of femoral head and femoral neck. Dedicated hip films may be helpful. Electronically Signed   By: Inez Catalina M.D.   On: 07/01/2016 16:54    Procedures Procedures (including critical care time)  Medications Ordered in ED Medications  morphine 2 MG/ML injection 2 mg (not administered)  esomeprazole (NEXIUM) suspension 20 mg (not administered)  amLODipine (NORVASC) tablet 5 mg (not administered)  losartan (COZAAR) tablet 100 mg (not administered)  0.9 %  sodium chloride infusion (not administered)  ondansetron (ZOFRAN) injection 4 mg (not administered)  oxyCODONE-acetaminophen (PERCOCET/ROXICET) 5-325 MG per tablet 1  tablet (not administered)  methocarbamol (ROBAXIN) tablet 500 mg (not administered)  senna (SENOKOT) tablet 8.6 mg (not administered)  polyethylene glycol (MIRALAX / GLYCOLAX) packet 17 g (not administered)  naproxen sodium (ANAPROX) tablet 220 mg (not administered)  morphine 4 MG/ML injection 4 mg (4 mg Intravenous Given 07/01/16 1930)  HYDROmorphone (DILAUDID) injection 1 mg (1 mg Intravenous Given 07/01/16 2020)     Initial Impression / Assessment and Plan / ED Course  I have reviewed the triage vital signs and the nursing notes.  Pertinent labs & imaging results that were available during my care of the patient were reviewed by me and considered in my medical decision making (see chart for details).     Final Clinical Impressions(s) / ED Diagnoses   Final diagnoses:  Fall  Essential hypertension  Arthritis    Labs: PT/INR, CBC, BMP  Imaging: DG Femur, DG hips unilateral  Consults: Orthopedics  Therapeutics: Morphine, Dilaudid  Discharge Meds:   Assessment/Plan:  83 year old male presents today status post fall. Patient has hip fracture. No other complaints from the fall. He did not strike his head. Patient is requesting Dr. Percell Miller for orthopedic evaluation. I consult to Dr. Percell Miller who agreed that he would see this patient. I consult the hospitalist service who will evaluate the patient, patient was transferred over [facility] for surgical evaluation.    New Prescriptions New Prescriptions   No medications on file     Okey Regal, PA-C 07/01/16 2120    Varney Biles, MD 07/02/16 1527

## 2016-07-01 NOTE — ED Notes (Signed)
Called to lobby to speak with pt. Pt is very upset about wait time. Explained that we have put in some protocol orders on pt and that they will be coming to get him for xray. Pt is very hard to direct and continues to be very angry that we are not immediately rushing him to a room. Tried to comfort pt and provide as much assurance as possible that we are working diligently to get him a room.

## 2016-07-01 NOTE — H&P (Addendum)
History and Physical    Roger Saunders [id number] DOB: [birth year] DOA: 07/01/2016  Referring MD/NP/PA:   PCP: No primary care provider on file.   Patient coming from:  The patient is coming from home.  At baseline, pt is independent for most of ADL.  Chief Complaint: right upper leg and hip pain after fall  HPI: Roger Saunders is a 83 y.o. male with medical history significant of hypertension, GERD, arthritis, kidney stone, who presents with right upper leg pain after fall.  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 The pain is constantly, 5 out of 10 in severity, nonradiating. Denies numbness in his legs. He was unable to ambulate on his own at that time. He denies prodrome of symptoms, no dizziness, unilateral weakness, vision change, hearing loss, chest pain or shortness rest. Patient does not have nausea, vomiting, diarrhea, abdominal pain, symptoms of UTI. No fever or chills.  ED Course: pt was found to have WBC 12.7, INR 0.99, electrolytes renal function okay, temperature normal, no tachycardia, SECTION 96% on room air. X-ray of right femur and right hip/pelvis both highly suspicious for impacted fracture at the junction of femoral head and femoral neck. Pt is admitted to med-surg bed as inpt. Ortho, Dr. Percell Miller was consulted by EDP.  Review of Systems:   General: no fevers, chills, no changes in body weight, has fatigue HEENT: no blurry vision, hearing changes or sore throat Respiratory: no dyspnea, coughing, wheezing CV: no chest pain, no palpitations GI: no nausea, vomiting, abdominal pain, diarrhea, constipation GU: no dysuria, burning on urination, increased urinary frequency, hematuria  Ext: no leg edema Neuro: no unilateral weakness, numbness, or tingling, no vision change or hearing loss Skin: no rash, no skin tear. MSK: has right upper leg and hip  tenderness Heme: No easy bruising.  Travel history: No recent long distant travel.  Allergy: No Known Allergies  Past Medical History:  Diagnosis Date  . Arthritis   . Hypertension   . Kidney stones     Past Surgical History:  Procedure Laterality Date  . APPENDECTOMY  1938  . KIDNEY STONE SURGERY      Social History:  reports that he quit smoking about 11 years ago. His smoking use included Pipe and Cigars. He has never used smokeless tobacco. He reports that he drinks alcohol. He reports that he does not use drugs.  Family History:  Family History  Problem Relation Age of Onset  . Heart attack Mother   . Prostate cancer Father   . Parkinson's disease Brother      Prior to Admission medications   Medication Sig Start Date End Date Taking? Authorizing Provider  amLODipine (NORVASC) 5 MG tablet Take 5 mg by mouth every evening.    Yes Historical Provider, MD  esomeprazole (NEXIUM) 20 MG packe Take 20 mg by mouth daily as needed (heartburn/acid reflux).   Yes Historical Provider, MD  losartan (COZAAR) 100 MG tablet Take 100 mg by mouth every evening.    Yes Historical Provider, MD  naproxen sodium (ANAPROX) 220 MG tablet Take 220-440 mg by mouth 2 (two) times daily as needed (pain).   Yes Historical Provider, MD    Physical Exam: Vitals:   07/01/16 1428 07/01/16 1832 07/01/16 2023  BP: 151/85 156/95 137/73  Pulse: 74 74 79  Resp: 16 22 18   Temp:  97.8 F (36.6 C)  98.2 F (36.8 C)  TempSrc: Oral  Oral  SpO2: 96% 100% 99%   General: Not in acute distress HEENT:       Eyes: PERRL, EOMI, no scleral icterus.       ENT: No discharge from the ears and nose, no pharynx injection, no tonsillar enlargement.        Neck: No JVD, no bruit, no mass felt. Heme: No neck lymph node enlargement. Cardiac: S1/S2, RRR, No murmurs, No gallops or rubs. Respiratory: Good air movement bilaterally. No rales, wheezing, rhonchi or rubs. GI: Soft, nondistended, nontender, no rebound pain,  no organomegaly, BS present. GU: No hematuria Ext: No pitting leg edema bilaterally. 2+DP/PT pulse bilaterally. Musculoskeletal: has right upper leg and hip tenderness. Skin: No rashes.  Neuro: Alert, oriented X3, cranial nerves II-XII grossly intact, moves all extremities normally.   Psych: Patient is not psychotic, no suicidal or hemocidal ideation.  Labs on Admission: I have personally reviewed following labs and imaging studies  CBC:  Recent Labs Lab 07/01/16 1929  WBC 12.7*  NEUTROABS 9.1*  HGB 15.1  HCT 43.4  MCV 95.6  PLT 0000000   Basic Metabolic Panel:  Recent Labs Lab 07/01/16 1929  NA 139  K 4.0  CL 104  CO2 25  GLUCOSE 117*  BUN 23*  CREATININE 0.91  CALCIUM 10.0   GFR: CrCl cannot be calculated (Unknown ideal weight.). Liver Function Tests: No results for input(s): AST, ALT, ALKPHOS, BILITOT, PROT, ALBUMIN in the last 168 hours. No results for input(s): LIPASE, AMYLASE in the last 168 hours. No results for input(s): AMMONIA in the last 168 hours. Coagulation Profile:  Recent Labs Lab 07/01/16 1942  INR 0.99   Cardiac Enzymes: No results for input(s): CKTOTAL, CKMB, CKMBINDEX, TROPONINI in the last 168 hours. BNP (last 3 results) No results for input(s): PROBNP in the last 8760 hours. HbA1C: No results for input(s): HGBA1C in the last 72 hours. CBG: No results for input(s): GLUCAP in the last 168 hours. Lipid Profile: No results for input(s): CHOL, HDL, LDLCALC, TRIG, CHOLHDL, LDLDIRECT in the last 72 hours. Thyroid Function Tests: No results for input(s): TSH, T4TOTAL, FREET4, T3FREE, THYROIDAB in the last 72 hours. Anemia Panel: No results for input(s): VITAMINB12, FOLATE, FERRITIN, TIBC, IRON, RETICCTPCT in the last 72 hours. Urine analysis:    Component Value Date/Time   COLORURINE YELLOW 11/29/2015 [place] 11/29/2015 0415   LABSPEC 1.012 11/29/2015 0415   PHURINE 6.5 11/29/2015 0415   GLUCOSEU NEGATIVE 11/29/2015  0415   HGBUR NEGATIVE 11/29/2015 0415   BILIRUBINUR NEGATIVE 11/29/2015 0415   KETONESUR NEGATIVE 11/29/2015 0415   PROTEINUR NEGATIVE 11/29/2015 0415   UROBILINOGEN 1.0 05/09/2008 2156   NITRITE NEGATIVE 11/29/2015 0415   LEUKOCYTESUR NEGATIVE 11/29/2015 0415   Sepsis Labs: @LABRCNTIP (procalcitonin:4,lacticidven:4) )No results found for this or any previous visit (from the past 240 hour(s)).   Radiological Exams on Admission: Dg Hip Unilat With Pelvis Min 4 Views Right  Result Date: 07/01/2016 CLINICAL DATA:  Recent fall with right leg pain, initial encounter EXAM: DG HIP (WITH OR WITHOUT PELVIS) 4+V RIGHT COMPARISON:  Films from earlier in the same day FINDINGS: There again seen irregularities along the inferior aspect of the right femoral head suggestive of an impacted fracture in the junction of the femoral head and neck. No other bony abnormality is seen. IMPRESSION: Changes suggestive of impacted fracture in the proximal right femur. Electronically Signed   By: Inez Catalina  M.D.   On: 07/01/2016 20:09   Dg Femur, Min 2 Views Right  Result Date: 07/01/2016 CLINICAL DATA:  Slip and fall with right leg pain, initial encounter EXAM: RIGHT FEMUR 2 VIEWS COMPARISON:  03/05/2013 FINDINGS: Irregularity at the junction of the femoral neck and femoral head is noted highly suspicious for an impacted fracture. The remainder of femur is within normal limits. Degenerative changes about the knee joint are seen. No effusion is noted. Vascular calcifications are seen. IMPRESSION: Changes highly suspicious for impacted fracture at the junction of femoral head and femoral neck. Dedicated hip films may be helpful. Electronically Signed   By: Inez Catalina M.D.   On: 07/01/2016 16:54     EKG:  Not done in ED, will get one.   Assessment/Plan Principal Problem:   Closed right hip fracture (HCC) Active Problems:   Essential hypertension   Arthritis   GERD (gastroesophageal reflux disease)    Fall   Closed right hip fracture-highly suspicious for impacted fracture at the junction of femoral head and femoral neck, as evidenced by x-ray. Patient has moderate pain now. No neurovascular compromise. Orthopedic surgeon, dr. Percell Miller was consulted.   - will admit to Med-surg bed - Pain control: morphine prn and percocet - When necessary Zofran for nausea - Robaxin for muscle spasm - type and cross - INR/PTT - NPO after MN  Leukocytosis: Likely due to stress-induced demargination. Patient does not have signs of infection. -Follow-up CBC  Hypertension: -continue amlodipine, Cozaar  GERD: -Protonix  Arthritis: -continue home Naproxen  DVT ppx: SCD Code Status: Full code Family Communication: Yes, patient's son at bed side Disposition Plan:  Anticipate discharge back to previous home environment Consults called:  Ortho, Dr. Percell Miller was consulted by EDP. Admission status:  medical floor/inpt.       Date of Service 07/01/2016    Ivor Costa [facility] Pager [phone]  If 7PM-7AM, please contact night-coverage [email or web address] Password [facility] 07/01/2016, 9:17 PM

## 2016-07-02 ENCOUNTER — Inpatient Hospital Stay (HOSPITAL_COMMUNITY): Payer: Medicare HMO | Admitting: Certified Registered Nurse Anesthetist

## 2016-07-02 ENCOUNTER — Encounter (HOSPITAL_COMMUNITY)
Admission: EM | Disposition: A | Discharge status: Home Health | Payer: Self-pay | Source: Home / Self Care | Attending: Family Medicine

## 2016-07-02 ENCOUNTER — Encounter (HOSPITAL_COMMUNITY): Payer: Self-pay | Admitting: Certified Registered Nurse Anesthetist

## 2016-07-02 ENCOUNTER — Inpatient Hospital Stay (HOSPITAL_COMMUNITY): Payer: Medicare HMO

## 2016-07-02 HISTORY — PX: HIP PINNING,CANNULATED: SHX1758

## 2016-07-02 LAB — CBC
HCT: 40.1 % (ref 39.0–52.0)
HEMATOCRIT: 37.9 % — AB (ref 39.0–52.0)
HEMOGLOBIN: 12.9 g/dL — AB (ref 13.0–17.0)
Hemoglobin: 13.4 g/dL (ref 13.0–17.0)
MCH: 32.6 pg (ref 26.0–34.0)
MCH: 32.2 pg (ref 26.0–34.0)
MCHC: 34 g/dL (ref 30.0–36.0)
MCHC: 33.4 g/dL (ref 30.0–36.0)
MCV: 95.7 fL (ref 78.0–100.0)
MCV: 96.4 fL (ref 78.0–100.0)
PLATELETS: 131 10*3/uL — AB (ref 150–400)
Platelets: 125 10*3/uL — ABNORMAL LOW (ref 150–400)
RBC: 3.96 MIL/uL — ABNORMAL LOW (ref 4.22–5.81)
RBC: 4.16 MIL/uL — ABNORMAL LOW (ref 4.22–5.81)
RDW: 12.3 % (ref 11.5–15.5)
RDW: 12.3 % (ref 11.5–15.5)
WBC: 7.8 10*3/uL (ref 4.0–10.5)
WBC: 9.4 10*3/uL (ref 4.0–10.5)

## 2016-07-02 LAB — BASIC METABOLIC PANEL
Anion gap: 7 (ref 5–15)
BUN: 20 mg/dL (ref 6–20)
CALCIUM: 8.5 mg/dL — AB (ref 8.9–10.3)
CHLORIDE: 107 mmol/L (ref 101–111)
CO2: 25 mmol/L (ref 22–32)
CREATININE: 0.82 mg/dL (ref 0.61–1.24)
GFR calc non Af Amer: >60 mL/min (ref >60)
Glucose, Bld: 121 mg/dL — ABNORMAL HIGH (ref 65–99)
Potassium: 3.7 mmol/L (ref 3.5–5.1)
SODIUM: 139 mmol/L (ref 135–145)

## 2016-07-02 LAB — TYPE AND SCREEN
ABO/RH(D): A POS
ABO/RH(D): A POS
ANTIBODY SCREEN: NEGATIVE
Antibody Screen: NEGATIVE

## 2016-07-02 LAB — MRSA PCR SCREENING: MRSA by PCR: NEGATIVE

## 2016-07-02 LAB — ABO/RH
ABO/RH(D): A POS
ABO/RH(D): A POS

## 2016-07-02 LAB — CREATININE, SERUM
CREATININE: 0.87 mg/dL (ref 0.61–1.24)
GFR calc Af Amer: >60 mL/min (ref >60)
GFR calc non Af Amer: >60 mL/min (ref >60)

## 2016-07-02 SURGERY — FIXATION, FEMUR, NECK, PERCUTANEOUS, USING SCREW
Anesthesia: General | Site: Hip | Laterality: Right

## 2016-07-02 MED ORDER — SUGAMMADEX SODIUM 200 MG/2ML IV SOLN
INTRAVENOUS | Status: DC | PRN
  Administered 2016-07-02: 200 mg via INTRAVENOUS

## 2016-07-02 MED ORDER — ACETAMINOPHEN 650 MG RE SUPP: 650.0000 mg | Freq: Four times a day (QID) | RECTAL | Status: DC | PRN

## 2016-07-02 MED ORDER — FENTANYL CITRATE (PF) 100 MCG/2ML IJ SOLN: 100.0000 ug | Freq: Once | INTRAMUSCULAR | Status: DC

## 2016-07-02 MED ORDER — CEFAZOLIN SODIUM-DEXTROSE 2-4 GM/100ML-% IV SOLN
INTRAVENOUS | Status: AC
  Filled 2016-07-02: qty 100

## 2016-07-02 MED ORDER — FENTANYL CITRATE (PF) 100 MCG/2ML IJ SOLN
INTRAMUSCULAR | Status: DC | PRN
  Administered 2016-07-02: 100 ug via INTRAVENOUS

## 2016-07-02 MED ORDER — LACTATED RINGERS IV SOLN
INTRAVENOUS | Status: DC
  Administered 2016-07-02 – 2016-07-03 (×2): via INTRAVENOUS

## 2016-07-02 MED ORDER — 0.9 % SODIUM CHLORIDE (POUR BTL) OPTIME
TOPICAL | Status: DC | PRN
  Administered 2016-07-02: 1000 mL

## 2016-07-02 MED ORDER — BUPIVACAINE HCL (PF) 0.25 % IJ SOLN
30.0000 mL | Freq: Once | INTRAMUSCULAR | Status: DC
  Filled 2016-07-02: qty 30

## 2016-07-02 MED ORDER — PHENYLEPHRINE HCL 10 MG/ML IJ SOLN
INTRAMUSCULAR | Status: DC | PRN
  Administered 2016-07-02: 80 ug via INTRAVENOUS

## 2016-07-02 MED ORDER — CEFAZOLIN SODIUM-DEXTROSE 2-4 GM/100ML-% IV SOLN
2.0000 g | Freq: Four times a day (QID) | INTRAVENOUS | Status: AC
  Administered 2016-07-03 (×3): 2 g via INTRAVENOUS
  Filled 2016-07-02 (×3): qty 100

## 2016-07-02 MED ORDER — KETOROLAC TROMETHAMINE 15 MG/ML IJ SOLN
7.5000 mg | Freq: Four times a day (QID) | INTRAMUSCULAR | Status: AC
  Administered 2016-07-02 – 2016-07-03 (×4): 7.5 mg via INTRAVENOUS
  Filled 2016-07-02 (×3): qty 1

## 2016-07-02 MED ORDER — KETOROLAC TROMETHAMINE 15 MG/ML IJ SOLN
INTRAMUSCULAR | Status: AC
  Filled 2016-07-02: qty 1

## 2016-07-02 MED ORDER — SORBITOL 70 % SOLN
30.0000 mL | Freq: Every day | Status: DC | PRN
  Filled 2016-07-02: qty 30

## 2016-07-02 MED ORDER — PROPOFOL 10 MG/ML IV BOLUS
INTRAVENOUS | Status: AC
  Filled 2016-07-02: qty 20

## 2016-07-02 MED ORDER — HYDROMORPHONE HCL 1 MG/ML IJ SOLN
0.2500 mg | INTRAMUSCULAR | Status: DC | PRN
  Administered 2016-07-02: 0.25 mg via INTRAVENOUS

## 2016-07-02 MED ORDER — FENTANYL CITRATE (PF) 100 MCG/2ML IJ SOLN
INTRAMUSCULAR | Status: AC
  Administered 2016-07-02: 50 ug via INTRAVENOUS
  Filled 2016-07-02: qty 2

## 2016-07-02 MED ORDER — BUPIVACAINE HCL (PF) 0.25 % IJ SOLN
INTRAMUSCULAR | Status: DC | PRN
  Administered 2016-07-02: 10 mL

## 2016-07-02 MED ORDER — CEFAZOLIN SODIUM-DEXTROSE 2-4 GM/100ML-% IV SOLN
2.0000 g | INTRAVENOUS | Status: AC
  Administered 2016-07-02: 2 g via INTRAVENOUS

## 2016-07-02 MED ORDER — LACTATED RINGERS IV SOLN
INTRAVENOUS | Status: DC
  Administered 2016-07-02: 14:00:00 via INTRAVENOUS

## 2016-07-02 MED ORDER — ROCURONIUM BROMIDE 50 MG/5ML IV SOSY
PREFILLED_SYRINGE | INTRAVENOUS | Status: AC
  Filled 2016-07-02: qty 5

## 2016-07-02 MED ORDER — SENNA 8.6 MG PO TABS
1.0000 | ORAL_TABLET | Freq: Two times a day (BID) | ORAL | Status: DC
  Administered 2016-07-02 – 2016-07-05 (×6): 8.6 mg via ORAL
  Filled 2016-07-02 (×6): qty 1

## 2016-07-02 MED ORDER — FENTANYL CITRATE (PF) 100 MCG/2ML IJ SOLN
INTRAMUSCULAR | Status: AC
  Administered 2016-07-02: 100 ug via INTRAVENOUS
  Filled 2016-07-02: qty 2

## 2016-07-02 MED ORDER — HYDROMORPHONE HCL 1 MG/ML IJ SOLN
INTRAMUSCULAR | Status: AC
  Filled 2016-07-02: qty 0.5

## 2016-07-02 MED ORDER — LIDOCAINE 2% (20 MG/ML) 5 ML SYRINGE
INTRAMUSCULAR | Status: AC
  Filled 2016-07-02: qty 5

## 2016-07-02 MED ORDER — OMEPRAZOLE 20 MG PO CPDR: 20.0000 mg | DELAYED_RELEASE_CAPSULE | Freq: Every day | ORAL | 2 refills | Status: DC

## 2016-07-02 MED ORDER — ENOXAPARIN SODIUM 40 MG/0.4ML ~~LOC~~ SOLN
40.0000 mg | SUBCUTANEOUS | Status: DC
  Administered 2016-07-03 – 2016-07-05 (×3): 40 mg via SUBCUTANEOUS
  Filled 2016-07-02 (×4): qty 0.4

## 2016-07-02 MED ORDER — FENTANYL CITRATE (PF) 100 MCG/2ML IJ SOLN
INTRAMUSCULAR | Status: AC
  Filled 2016-07-02: qty 2

## 2016-07-02 MED ORDER — PROPOFOL 10 MG/ML IV BOLUS
INTRAVENOUS | Status: DC | PRN
  Administered 2016-07-02: 110 mg via INTRAVENOUS

## 2016-07-02 MED ORDER — FENTANYL CITRATE (PF) 100 MCG/2ML IJ SOLN
50.0000 ug | Freq: Once | INTRAMUSCULAR | Status: AC
  Administered 2016-07-02: 50 ug via INTRAVENOUS
  Administered 2016-07-02: 100 ug via INTRAVENOUS
  Administered 2016-07-02: 50 ug via INTRAVENOUS
  Filled 2016-07-02: qty 1

## 2016-07-02 MED ORDER — EPHEDRINE SULFATE 50 MG/ML IJ SOLN
INTRAMUSCULAR | Status: DC | PRN
  Administered 2016-07-02 (×2): 15 mg via INTRAVENOUS
  Administered 2016-07-02: 10 mg via INTRAVENOUS

## 2016-07-02 MED ORDER — ROCURONIUM BROMIDE 100 MG/10ML IV SOLN
INTRAVENOUS | Status: DC | PRN
  Administered 2016-07-02: 50 mg via INTRAVENOUS

## 2016-07-02 MED ORDER — MORPHINE SULFATE (PF) 2 MG/ML IV SOLN
2.0000 mg | INTRAVENOUS | Status: DC | PRN
  Administered 2016-07-02: 2 mg via INTRAVENOUS
  Filled 2016-07-02: qty 1

## 2016-07-02 MED ORDER — LIDOCAINE HCL (CARDIAC) 20 MG/ML IV SOLN
INTRAVENOUS | Status: DC | PRN
  Administered 2016-07-02: 80 mg via INTRAVENOUS

## 2016-07-02 MED ORDER — DOCUSATE SODIUM 100 MG PO CAPS: 100.0000 mg | ORAL_CAPSULE | Freq: Two times a day (BID) | ORAL | 0 refills | Status: DC

## 2016-07-02 MED ORDER — ONDANSETRON HCL 4 MG/2ML IJ SOLN
INTRAMUSCULAR | Status: DC | PRN
  Administered 2016-07-02: 4 mg via INTRAVENOUS

## 2016-07-02 MED ORDER — ASPIRIN EC 325 MG PO TBEC: 325.0000 mg | DELAYED_RELEASE_TABLET | Freq: Every day | ORAL | 0 refills | Status: DC

## 2016-07-02 MED ORDER — CHLORHEXIDINE GLUCONATE 4 % EX LIQD: 60.0000 mL | Freq: Once | CUTANEOUS | Status: DC

## 2016-07-02 MED ORDER — DOCUSATE SODIUM 100 MG PO CAPS
100.0000 mg | ORAL_CAPSULE | Freq: Two times a day (BID) | ORAL | Status: DC
  Administered 2016-07-02 – 2016-07-05 (×6): 100 mg via ORAL
  Filled 2016-07-02 (×6): qty 1

## 2016-07-02 MED ORDER — MAGNESIUM CITRATE PO SOLN: 1.0000 | Freq: Once | ORAL | Status: DC | PRN

## 2016-07-02 MED ORDER — PROMETHAZINE HCL 25 MG/ML IJ SOLN: 6.2500 mg | INTRAMUSCULAR | Status: DC | PRN

## 2016-07-02 MED ORDER — HYDROCODONE-ACETAMINOPHEN 5-325 MG PO TABS
1.0000 | ORAL_TABLET | ORAL | Status: DC | PRN
  Administered 2016-07-03 – 2016-07-04 (×2): 1 via ORAL
  Filled 2016-07-02 (×2): qty 1

## 2016-07-02 MED ORDER — POVIDONE-IODINE 10 % EX SWAB: 2.0000 "application " | Freq: Once | CUTANEOUS | Status: DC

## 2016-07-02 MED ORDER — HYDROCODONE-ACETAMINOPHEN 5-325 MG PO TABS: 1.0000 | ORAL_TABLET | ORAL | 0 refills | Status: DC | PRN

## 2016-07-02 MED ORDER — ACETAMINOPHEN 325 MG PO TABS: 650.0000 mg | ORAL_TABLET | Freq: Four times a day (QID) | ORAL | Status: DC | PRN

## 2016-07-02 MED ORDER — LACTATED RINGERS IV SOLN
INTRAVENOUS | Status: DC | PRN
  Administered 2016-07-02 (×2): via INTRAVENOUS

## 2016-07-02 MED ORDER — BACLOFEN 10 MG PO TABS: 10.0000 mg | ORAL_TABLET | Freq: Three times a day (TID) | ORAL | 0 refills | Status: DC | PRN

## 2016-07-02 SURGICAL SUPPLY — 32 items
BIT DRILL 4.9 CANNULATED (BIT) ×1
BIT DRILL CANN QC 4.9 LRG (BIT) IMPLANT
BNDG COHESIVE 4X5 TAN STRL (GAUZE/BANDAGES/DRESSINGS) ×2 IMPLANT
BNDG GAUZE ELAST 4 BULKY (GAUZE/BANDAGES/DRESSINGS) ×2 IMPLANT
COVER PERINEAL POST (MISCELLANEOUS) ×2 IMPLANT
COVER SURGICAL LIGHT HANDLE (MISCELLANEOUS) ×2 IMPLANT
DRAPE STERI IOBAN 125X83 (DRAPES) ×2 IMPLANT
DRILL BIT CANNULATED 4.9 (BIT) ×2
DRSG MEPILEX BORDER 4X4 (GAUZE/BANDAGES/DRESSINGS) ×2 IMPLANT
DURAPREP 26ML APPLICATOR (WOUND CARE) ×2 IMPLANT
ELECT REM PT RETURN 9FT ADLT (ELECTROSURGICAL) ×2
ELECTRODE REM PT RTRN 9FT ADLT (ELECTROSURGICAL) ×1 IMPLANT
GLOVE BIO SURGEON STRL SZ7.5 (GLOVE) ×4 IMPLANT
GLOVE BIOGEL PI IND STRL 8 (GLOVE) ×2 IMPLANT
GLOVE BIOGEL PI INDICATOR 8 (GLOVE) ×2
GOWN STRL REUS W/ TWL LRG LVL3 (GOWN DISPOSABLE) ×3 IMPLANT
GOWN STRL REUS W/TWL LRG LVL3 (GOWN DISPOSABLE) ×6
GUIDEWIRE THRD ASNIS 3.2X300 (WIRE) ×3 IMPLANT
KIT BASIN OR (CUSTOM PROCEDURE TRAY) ×2 IMPLANT
KIT ROOM TURNOVER OR (KITS) ×2 IMPLANT
MANIFOLD NEPTUNE II (INSTRUMENTS) ×2 IMPLANT
NS IRRIG 1000ML POUR BTL (IV SOLUTION) ×2 IMPLANT
PACK GENERAL/GYN (CUSTOM PROCEDURE TRAY) ×2 IMPLANT
PAD ARMBOARD 7.5X6 YLW CONV (MISCELLANEOUS) ×4 IMPLANT
SCREW ASNIS 100MM (Screw) ×1 IMPLANT
SCREW ASNIS 95MM (Screw) ×2 IMPLANT
SUT MON AB 2-0 CT1 36 (SUTURE) ×2 IMPLANT
SUT VIC AB 0 CT1 27 (SUTURE) ×2
SUT VIC AB 0 CT1 27XBRD ANBCTR (SUTURE) IMPLANT
TOWEL OR 17X24 6PK STRL BLUE (TOWEL DISPOSABLE) ×2 IMPLANT
TOWEL OR 17X26 10 PK STRL BLUE (TOWEL DISPOSABLE) ×2 IMPLANT
WATER STERILE IRR 1000ML POUR (IV SOLUTION) IMPLANT

## 2016-07-02 NOTE — Anesthesia Procedure Notes (Signed)
Procedure Name: Intubation Date/Time: 07/02/2016 5:36 PM Performed by: Manus Gunning, Keisy Strickler J Pre-anesthesia Checklist: Patient identified, Emergency Drugs available, Suction available, Timeout performed and Patient being monitored Patient Re-evaluated:Patient Re-evaluated prior to inductionOxygen Delivery Method: Circle system utilized Preoxygenation: Pre-oxygenation with 100% oxygen Intubation Type: IV induction Ventilation: Mask ventilation without difficulty Laryngoscope Size: Mac and 4 Grade View: Grade II Tube type: Oral Tube size: 7.5 mm Number of attempts: 1 Placement Confirmation: ETT inserted through vocal cords under direct vision,  positive ETCO2 and breath sounds checked- equal and bilateral Secured at: 21 cm Tube secured with: Tape Dental Injury: Teeth and Oropharynx as per pre-operative assessment

## 2016-07-02 NOTE — Op Note (Signed)
07/01/2016 - 07/02/2016  5:30 PM  PATIENT:  Roger Saunders    PRE-OPERATIVE DIAGNOSIS:  Right Hip Fracture  POST-OPERATIVE DIAGNOSIS:  Same  PROCEDURE:  CANNULATED HIP PINNING  SURGEON:  Dominic Rhome, Ernesta Amble, MD  ASSISTANT: Roxan Hockey, PA-C, he was present and scrubbed throughout the case, critical for completion in a timely fashion, and for retraction, instrumentation, and closure.   ANESTHESIA:   General  PREOPERATIVE INDICATIONS:  [name] is a  83 y.o. male who fell and was found to have a diagnosis of Right Hip Fracture who elected for surgical management.    The risks benefits and alternatives were discussed with the patient preoperatively including but not limited to the risks of infection, bleeding, nerve injury, cardiopulmonary complications, blood clots, malunion, nonunion, avascular necrosis, the need for revision surgery, the potential for conversion to hemiarthroplasty, among others, and the patient was willing to proceed.  OPERATIVE IMPLANTS: 6.5 mm cannulated screws x3  OPERATIVE FINDINGS: Clinical osteoporosis with weak bone, proximal femur  OPERATIVE PROCEDURE: The patient was brought to the operating room and placed in supine position. IV antibiotics were given. General anesthesia administered. Foley was also given. The patient was placed on the fracture table. The operative extremity was positioned, without any significant reduction maneuver and was prepped and draped in usual sterile fashion.  Time out was performed.  Small incisions were made distal to the greater trochanter, and 3 guidewires were introduced Into an inverted triangle configuration. The lengths were measured. The reduction was slightly valgus, and near-anatomic. I opened the cortex with a cannulated drill, and then placed the screws into position. Satisfactory fixation was achieved. I sequentially tightened the screws by hand.  I performed a live fluoroscopic exam and no screw penetrance was  noted. All threads crossed the fracture site.   The wounds were irrigated copiously, and repaired with Vicryl with Steri-Strips and sterile gauze. There no complications and the patient tolerated the procedure well.  The patient will be weightbearing as tolerated, VTE prophylaxis will be: mobilization and chemical px.

## 2016-07-02 NOTE — Anesthesia Postprocedure Evaluation (Signed)
Anesthesia Post Note  Patient: Roger Saunders  Procedure(s) Performed: Procedure(s) (LRB): CANNULATED HIP PINNING (Right)  Anesthesia Type: General       Last Vitals:  Vitals:   07/02/16 0744 07/02/16 0850  BP:  (!) 115/58  Pulse:  64  Resp:  16  Temp: 36.7 C 36.7 C    Last Pain:  Vitals:   07/02/16 1055  TempSrc:   PainSc: Asleep                 Advay Volante

## 2016-07-02 NOTE — Progress Notes (Signed)
Patient trasfered from [place] to [phone] via [place]; alert and oriented x 4;  complaints of pain in right hip (pt was medicated before transport; IV saline locked in RAC; Orient patient to room and unit;  gave patient care guide; instructed how to use the call bell and  fall risk precautions. Will continue to monitor the patient.

## 2016-07-02 NOTE — Anesthesia Preprocedure Evaluation (Addendum)
Anesthesia Evaluation  Patient identified by MRN, date of birth, ID band Patient awake    Reviewed: Allergy & Precautions, NPO status , Patient's Chart, lab work & pertinent test results  History of Anesthesia Complications Negative for: history of anesthetic complications  Airway Mallampati: II  TM Distance: >3 FB Neck ROM: Full    Dental  (+) Teeth Intact   Pulmonary former smoker,    breath sounds clear to auscultation       Cardiovascular hypertension,  Rhythm:Regular Rate:Normal     Neuro/Psych    GI/Hepatic GERD  ,  Endo/Other    Renal/GU Renal disease     Musculoskeletal  (+) Arthritis , fx hip   Abdominal   Peds  Hematology   Anesthesia Other Findings   Reproductive/Obstetrics                            Anesthesia Physical Anesthesia Plan  ASA: III  Anesthesia Plan: General   Post-op Pain Management:    Induction: Intravenous  Airway Management Planned: Oral ETT  Additional Equipment:   Intra-op Plan:   Post-operative Plan: Extubation in OR  Informed Consent: I have reviewed the patients History and Physical, chart, labs and discussed the procedure including the risks, benefits and alternatives for the proposed anesthesia with the patient or authorized representative who has indicated his/her understanding and acceptance.   Dental advisory given  Plan Discussed with:   Anesthesia Plan Comments:        Anesthesia Quick Evaluation

## 2016-07-02 NOTE — ED Notes (Signed)
[facility] at bedside 

## 2016-07-02 NOTE — ED Notes (Signed)
Report attempted was told nurse would call back.  

## 2016-07-02 NOTE — Transfer of Care (Signed)
Immediate Anesthesia Transfer of Care Note  Patient: Roger Saunders  Procedure(s) Performed: Procedure(s): CANNULATED HIP PINNING (Right)  Patient Location: PACU  Anesthesia Type:General  Level of Consciousness: awake  Airway & Oxygen Therapy: Patient Spontanous Breathing  Post-op Assessment: Report given to RN and Post -op Vital signs reviewed and stable  Post vital signs: Reviewed and stable  Last Vitals:  Vitals:   07/02/16 0744 07/02/16 0850  BP:  (!) 115/58  Pulse:  64  Resp:  16  Temp: 36.7 C 36.7 C    Last Pain:  Vitals:   07/02/16 1055  TempSrc:   PainSc: Asleep      Patients Stated Pain Goal: 4 ([id number])  Complications: No apparent anesthesia complications

## 2016-07-02 NOTE — Progress Notes (Signed)
PT Cancellation Note  Patient Details Name: ALESANDRO PETITHOMME MRN: [id number] DOB: Jun 18, 1931   Cancelled Treatment:    Reason Eval/Treat Not Completed: Patient not medically ready (Ppatient has a hip fracture. For surgery at some point most likely)Will follow  And initiate when medically ready.    Claretha Cooper 07/02/2016, 7:17 AM Tresa Endo PT [phone]

## 2016-07-02 NOTE — Progress Notes (Signed)
Patient is going to OR; alert and oriented x 4; family at bedside. [place] was giving to nurse in short stay.

## 2016-07-02 NOTE — Progress Notes (Signed)
OT Cancellation Note  Patient Details Name: Roger Saunders MRN: [id number] DOB: [birth year]   Cancelled Treatment:    Reason Eval/Treat Not Completed: Medical issues which prohibited therapy.  Awaiting sx. Please reorder after this.  Thank you.  Vernia Teem 07/02/2016, 7:15 AM  Lesle Chris, OTR/L [phone] 07/02/2016

## 2016-07-02 NOTE — ED Notes (Signed)
Pt moved from er stretcher to hospital bed without difficulty.

## 2016-07-02 NOTE — Interval H&P Note (Signed)
History and Physical Interval Note:  07/02/2016 2:19 PM  Roger Saunders  has presented today for surgery, with the diagnosis of Right Hip Fracture  The various methods of treatment have been discussed with the patient and family. After consideration of risks, benefits and other options for treatment, the patient has consented to  Procedure(s): CANNULATED HIP PINNING (Right) as a surgical intervention .  The patient's history has been reviewed, patient examined, no change in status, stable for surgery.  I have reviewed the patient's chart and labs.  Questions were answered to the patient's satisfaction.     Shaye Lagace D

## 2016-07-02 NOTE — Progress Notes (Signed)
PROGRESS NOTE    Roger Saunders  [id number] DOB: Oct 19, 1931 DOA: 07/01/2016 PCP: No primary care provider on file.   Brief Narrative:  83 y/o presenting after fall with fx of femoral head  Assessment & Plan:   Principal Problem:   Closed right hip fracture ([place]) - Ortho assisting - supportive therapy - PT evaluation  Active Problems:   Essential hypertension - stable continue amlodipine and losartan   Arthritis   GERD (gastroesophageal reflux disease)   Fall   DVT prophylaxis: per surgeon Code Status: Full Disposition Plan: pending surgeon and PT evaluation   Consultants:   Cherlyn Cushing   Procedures: per ortho   Antimicrobials: cefazolin   Subjective: No new complaints  Objective: Vitals:   07/02/16 0730 07/02/16 0744 07/02/16 0836 07/02/16 0850  BP:    (!) 115/58  Pulse: 61   64  Resp: 18   16  Temp:  98.1 F (36.7 C)  98.1 F (36.7 C)  TempSrc:  Oral  Oral  SpO2: 97%   97%  Weight:   89.4 kg (197 lb)   Height:   5\' 10"  (1.778 m)    No intake or output data in the 24 hours ending 07/02/16 1550 Filed Weights   07/02/16 0836  Weight: 89.4 kg (197 lb)    Examination:  General exam: Appears calm and comfortable  Respiratory system: equal chest rise, Respiratory effort normal. Cardiovascular system: no cyanosis     Data Reviewed: I have personally reviewed following labs and imaging studies  CBC:  Recent Labs Lab 07/01/16 1929 07/02/16 0525  WBC 12.7* 7.8  NEUTROABS 9.1*  --   HGB 15.1 12.9*  HCT 43.4 37.9*  MCV 95.6 95.7  PLT 158 0000000*   Basic Metabolic Panel:  Recent Labs Lab 07/01/16 1929 07/02/16 0525  NA 139 139  K 4.0 3.7  CL 104 107  CO2 25 25  GLUCOSE 117* 121*  BUN 23* 20  CREATININE 0.91 0.82  CALCIUM 10.0 8.5*   GFR: Estimated Creatinine Clearance: 78.2 mL/min (by C-G formula based on SCr of 0.82 mg/dL). Liver Function Tests: No results for input(s): AST, ALT, ALKPHOS, BILITOT, PROT, ALBUMIN in the  last 168 hours. No results for input(s): LIPASE, AMYLASE in the last 168 hours. No results for input(s): AMMONIA in the last 168 hours. Coagulation Profile:  Recent Labs Lab 07/01/16 1942  INR 0.99   Cardiac Enzymes: No results for input(s): CKTOTAL, CKMB, CKMBINDEX, TROPONINI in the last 168 hours. BNP (last 3 results) No results for input(s): PROBNP in the last 8760 hours. HbA1C: No results for input(s): HGBA1C in the last 72 hours. CBG: No results for input(s): GLUCAP in the last 168 hours. Lipid Profile: No results for input(s): CHOL, HDL, LDLCALC, TRIG, CHOLHDL, LDLDIRECT in the last 72 hours. Thyroid Function Tests: No results for input(s): TSH, T4TOTAL, FREET4, T3FREE, THYROIDAB in the last 72 hours. Anemia Panel: No results for input(s): VITAMINB12, FOLATE, FERRITIN, TIBC, IRON, RETICCTPCT in the last 72 hours. Sepsis Labs: No results for input(s): PROCALCITON, LATICACIDVEN in the last 168 hours.  Recent Results (from the past 240 hour(s))  MRSA PCR Screening     Status: None   Collection Time: 07/02/16 12:50 PM  Result Value Ref Range Status   MRSA by PCR NEGATIVE NEGATIVE Final    Comment:        The GeneXpert MRSA Assay (FDA approved for NASAL specimens only), is one component of a comprehensive MRSA colonization surveillance program. It is  not intended to diagnose MRSA infection nor to guide or monitor treatment for MRSA infections.          Radiology Studies: Dg Hip Unilat With Pelvis Min 4 Views Right  Result Date: 07/01/2016 CLINICAL DATA:  Recent fall with right leg pain, initial encounter EXAM: DG HIP (WITH OR WITHOUT PELVIS) 4+V RIGHT COMPARISON:  Films from earlier in the same day FINDINGS: There again seen irregularities along the inferior aspect of the right femoral head suggestive of an impacted fracture in the junction of the femoral head and neck. No other bony abnormality is seen. IMPRESSION: Changes suggestive of impacted fracture in the  proximal right femur. Electronically Signed   By: Inez Catalina M.D.   On: 07/01/2016 20:09   Dg Femur, Min 2 Views Right  Result Date: 07/01/2016 CLINICAL DATA:  Slip and fall with right leg pain, initial encounter EXAM: RIGHT FEMUR 2 VIEWS COMPARISON:  03/05/2013 FINDINGS: Irregularity at the junction of the femoral neck and femoral head is noted highly suspicious for an impacted fracture. The remainder of femur is within normal limits. Degenerative changes about the knee joint are seen. No effusion is noted. Vascular calcifications are seen. IMPRESSION: Changes highly suspicious for impacted fracture at the junction of femoral head and femoral neck. Dedicated hip films may be helpful. Electronically Signed   By: Inez Catalina M.D.   On: 07/01/2016 16:54    Scheduled Meds: . [MAR Hold] amLODipine  5 mg Oral QPM  . ceFAZolin      . [START ON 07/03/2016]  ceFAZolin (ANCEF) IV  2 g Intravenous On Call to OR  . chlorhexidine  60 mL Topical Once  . [MAR Hold] losartan  100 mg Oral QPM  . povidone-iodine  2 application Topical Once   Continuous Infusions: . sodium chloride 1,000 mL (07/02/16 0843)  . lactated ringers 10 mL/hr at 07/02/16 1402     LOS: 1 day    Time spent: 31 min    Velvet Bathe, MD [facility] Pager [phone]  If 7PM-7AM, please contact night-coverage [email or web address] Password [email or web address] 07/02/2016, 3:50 PM

## 2016-07-02 NOTE — Anesthesia Postprocedure Evaluation (Addendum)
Anesthesia Post Note  Patient: Roger Saunders  Procedure(s) Performed: Procedure(s) (LRB): CANNULATED HIP PINNING (Right)  Patient location during evaluation: PACU Anesthesia Type: General Level of consciousness: sedated Pain management: pain level controlled Vital Signs Assessment: post-procedure vital signs reviewed and stable Respiratory status: spontaneous breathing and respiratory function stable Cardiovascular status: stable Anesthetic complications: no       Last Vitals:  Vitals:   07/02/16 1857 07/02/16 1924  BP: 135/66   Pulse: 82 84  Resp: 17 18  Temp:      Last Pain:  Vitals:   07/02/16 1924  TempSrc:   PainSc: 5                  Kelven Flater DANIEL

## 2016-07-02 NOTE — Progress Notes (Signed)
Initial Nutrition Assessment  DOCUMENTATION CODES:   Not applicable  INTERVENTION:   -RD will follow for diet advancement and supplement as appropriate  NUTRITION DIAGNOSIS:   Inadequate oral intake related to inability to eat as evidenced by NPO status.  GOAL:   Patient will meet greater than or equal to 90% of their needs  MONITOR:   PO intake, Supplement acceptance, Diet advancement, Labs, Weight trends, Skin, I & O's  REASON FOR ASSESSMENT:   Consult Assessment of nutrition requirement/status  ASSESSMENT:   83 y/o presenting after fall with fx of femoral head  Pt admitted with closed rt hip fx.   Pt down in OR for rt cannulated hip pinning. Unable to complete Nutrition-Focused physical exam at this time. No family available to provide further nutrition hx.   Per chart review, wt usually ranges between 196-200#. Wt has been stable over the past several years per [facility] and Care Everywhere Records.   Pt has been NPO since admission. Will follow for nutritional adequacy with diet advancement.   Labs reviewed.   Diet Order:  Diet NPO time specified Except for: Ice Chips, Sips with Meds Diet NPO time specified Except for: Sips with Meds  Skin:  Reviewed, no issues  Last BM:  07/01/16  Height:   Ht Readings from Last 1 Encounters:  07/02/16 5\' 10"  (1.778 m)    Weight:   Wt Readings from Last 1 Encounters:  07/02/16 197 lb (89.4 kg)    Ideal Body Weight:  75.5 kg  BMI:  Body mass index is 28.27 kg/m.  Estimated Nutritional Needs:   Kcal:  1700-1900  Protein:  85-100 grams  Fluid:  1.7-1.9 L  EDUCATION NEEDS:   No education needs identified at this time  Evalise Abruzzese A. Jimmye Norman, RD, LDN, CDE Pager: [phone] After hours Pager: [phone]

## 2016-07-02 NOTE — Consult Note (Signed)
ORTHOPAEDIC CONSULTATION  REQUESTING PHYSICIAN: Velvet Bathe, MD  Chief Complaint: right hip pain.  Assessment / Plan: Principal Problem:   Closed right hip fracture [facility]) Active Problems:   Essential hypertension   Arthritis   GERD (gastroesophageal reflux disease)   Fall  Plan for Operative fixation today.  Cannulated hip pinning. -NPO -Medicine team to admit and perform pre-op clearance -PT/OT post op -will ammend WB status postop, bedrest for now -Possibly will require Rehab or SNF placement upon discharge.  Lives alone, but may stay with neighbor.  Motivated patient and may be able to d/c home w/ HHPT. -VTE prophylaxis: scd  HPI: Roger Saunders is a 83 y.o. 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  Presented to ED where XR suggestive of impacted fracture in the proximal right femur..  Orthopedics was consulted for evaluation.    Past Medical History:  Diagnosis Date  . Arthritis   . Hypertension   . Kidney stones    Past Surgical History:  Procedure Laterality Date  . APPENDECTOMY  1938  . KIDNEY STONE SURGERY     Social History   Social History  . Marital status: Single    Spouse name: N/A  . Number of children: N/A  . Years of education: N/A   Social History Main Topics  . Smoking status: Former Smoker    Types: Pipe, Cigars    Quit date: 2007  . Smokeless tobacco: Never Used  . Alcohol use Yes     Comment: "a litle bit of red wine"  . Drug use: No  . Sexual activity: Not Asked   Other Topics Concern  . None   Social History Narrative  . None   Family History  Problem Relation Age of Onset  . Heart attack Mother   . Prostate cancer Father   . Parkinson's disease Brother    No Known Allergies Prior to Admission medications   Medication Sig Start Date End Date Taking? Authorizing Provider  amLODipine (NORVASC) 5 MG tablet Take 5 mg by mouth every evening.    Yes Historical  Provider, MD  esomeprazole (NEXIUM) 20 MG packet Take 20 mg by mouth daily as needed (heartburn/acid reflux).   Yes Historical Provider, MD  losartan (COZAAR) 100 MG tablet Take 100 mg by mouth every evening.    Yes Historical Provider, MD  naproxen sodium (ANAPROX) 220 MG tablet Take 220-440 mg by mouth 2 (two) times daily as needed (pain).   Yes Historical Provider, MD   Dg Hip Unilat With Pelvis Min 4 Views Right  Result Date: 07/01/2016 CLINICAL DATA:  Recent fall with right leg pain, initial encounter EXAM: DG HIP (WITH OR WITHOUT PELVIS) 4+V RIGHT COMPARISON:  Films from earlier in the same day FINDINGS: There again seen irregularities along the inferior aspect of the right femoral head suggestive of an impacted fracture in the junction of the femoral head and neck. No other bony abnormality is seen. IMPRESSION: Changes suggestive of impacted fracture in the proximal right femur. Electronically Signed   By: Inez Catalina M.D.   On: 07/01/2016 20:09   Dg Femur, Min 2 Views Right  Result Date: 07/01/2016 CLINICAL DATA:  Slip and fall with right leg pain, initial encounter EXAM: RIGHT FEMUR 2 VIEWS COMPARISON:  03/05/2013 FINDINGS: Irregularity at the junction of the femoral neck and femoral head is noted highly suspicious for an impacted fracture. The remainder of femur is within  normal limits. Degenerative changes about the knee joint are seen. No effusion is noted. Vascular calcifications are seen. IMPRESSION: Changes highly suspicious for impacted fracture at the junction of femoral head and femoral neck. Dedicated hip films may be helpful. Electronically Signed   By: Inez Catalina M.D.   On: 07/01/2016 16:54    Positive ROS: All other systems have been reviewed and were otherwise negative with the exception of those mentioned in the HPI and as above.  Objective: Labs cbc  Recent Labs  07/01/16 1929 07/02/16 0525  WBC 12.7* 7.8  HGB 15.1 12.9*  HCT 43.4 37.9*  PLT 158 125*    Labs  coag  Recent Labs  07/01/16 1942  INR 0.99     Recent Labs  07/01/16 1929 07/02/16 0525  NA 139 139  K 4.0 3.7  CL 104 107  CO2 25 25  GLUCOSE 117* 121*  BUN 23* 20  CREATININE 0.91 0.82  CALCIUM 10.0 8.5*    Physical Exam: Vitals:   07/02/16 0744 07/02/16 0850  BP:  (!) 115/58  Pulse:  64  Resp:  16  Temp: 98.1 F (36.7 C) 98.1 F (36.7 C)   General: Alert, no acute distress.  Son and neighbor/friend in room. Mental status: Alert and Oriented x3 Neurologic: Speech Clear and organized, no gross focal findings or movement disorder appreciated. Respiratory: No cyanosis, no use of accessory musculature Cardiovascular: No pedal edema GI: Abdomen is soft and non-tender, non-distended. Skin: Warm and dry.  No lesions in the area of chief complaint  Extremities: Warm and well perfused w/o edema Psychiatric: Patient is competent for consent with normal mood and affect  MUSCULOSKELETAL:  Right hip pain w/ rom.  NVI  / Sensation intact distally. Other extremities are atraumatic with painless ROM and NVI.   Prudencio Burly III PA-C 07/02/2016 11:40 AM

## 2016-07-02 NOTE — ED Notes (Signed)
Moved patient to a hospital bed for better comfort. Dimmed lights and patient has call bell.
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
## 2016-07-03 DIAGNOSIS — K219 Gastro-esophageal reflux disease without esophagitis: Secondary | ICD-10-CM

## 2016-07-03 DIAGNOSIS — I10 Essential (primary) hypertension: Secondary | ICD-10-CM

## 2016-07-03 MED ORDER — CALCIUM CARBONATE ANTACID 500 MG PO CHEW
1.0000 | CHEWABLE_TABLET | Freq: Three times a day (TID) | ORAL | Status: DC | PRN
  Administered 2016-07-03: 200 mg via ORAL
  Filled 2016-07-03: qty 1
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
## 2016-07-03 NOTE — Progress Notes (Signed)
   Assessment: 1 Day Post-Op  S/P Procedure(s) (LRB): CANNULATED HIP PINNING (Right) by Dr. Ernesta Amble. Murphy on 07/02/16  Principal Problem:   Closed right hip fracture ([place]) Active Problems:   Essential hypertension   Arthritis   GERD (gastroesophageal reflux disease)   Fall   Plan: Advance diet Up with therapy  Weight Bearing: Weight Bearing as Tolerated (WBAT)  Dressings: prn.  VTE prophylaxis: Aspirin, SCDs, ambulation Dispo: PT eval pending. He's thinking that he may go home or neighbor's house.  Subjective: Patient reports pain as mild. Pain controlled with PO meds.  Tolerating diet.  Urinating.  No CP, SOB. Not yet OOB.  Objective:   VITALS:   Vitals:   07/02/16 2212 07/02/16 2216 07/03/16 0244 07/03/16 0549  BP: 120/71 124/68 (!) 148/76 136/75  Pulse: 77 79 73 70  Resp:   16 15  Temp:   97.7 F (36.5 C) 98.1 F (36.7 C)  TempSrc:   Oral Oral  SpO2: 96% 95% 98% 98%  Weight:      Height:       CBC Latest Ref Rng & Units 07/02/2016 07/02/2016 07/01/2016  WBC 4.0 - 10.5 K/uL 9.4 7.8 12.7(H)  Hemoglobin 13.0 - 17.0 g/dL 13.4 12.9(L) 15.1  Hematocrit 39.0 - 52.0 % 40.1 37.9(L) 43.4  Platelets 150 - 400 K/uL 131(L) 125(L) 158   BMP Latest Ref Rng & Units 07/02/2016 07/02/2016 07/01/2016  Glucose 65 - 99 mg/dL - 121(H) 117(H)  BUN 6 - 20 mg/dL - 20 23(H)  Creatinine 0.61 - 1.24 mg/dL 0.87 0.82 0.91  Sodium 135 - 145 mmol/L - 139 139  Potassium 3.5 - 5.1 mmol/L - 3.7 4.0  Chloride 101 - 111 mmol/L - 107 104  CO2 22 - 32 mmol/L - 25 25  Calcium 8.9 - 10.3 mg/dL - 8.5(L) 10.0   Intake/Output      01/23 0701 - 01/24 0700 01/24 0701 - 01/25 0700   P.O. 300    I.V. (mL/kg) 2256.7 (25.2)    IV Piggyback 200    Total Intake(mL/kg) 2756.7 (30.8)    Urine (mL/kg/hr) 1300 (0.6) 200 (1.4)   Blood 10 (0)    Total Output 1310 200   Net +1446.7 -200          Physical Exam: General: NAD.   Resp: No increased wob Cardio: regular rate and rhythm ABD  soft Neurologically intact MSK Neurovascularly intact Sensation intact distally Dorsiflexion/Plantar flexion intact Incision: dressing C/D/I  Prudencio Burly III, PA-C 07/03/2016, 8:38 AM
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
## 2016-07-03 NOTE — Progress Notes (Signed)
PROGRESS NOTE    Roger Saunders  [id number] DOB: 1931-09-17 DOA: 07/01/2016 PCP: No primary care provider on file.   Brief Narrative:  83 y/o presenting after fall with fx of femoral head  Assessment & Plan:   Principal Problem:   Closed right hip fracture ([place]) - Ortho on board. - supportive therapy - PT evaluation  Active Problems:   Essential hypertension - stable continue amlodipine and losartan   Arthritis   GERD (gastroesophageal reflux disease)   Fall   DVT prophylaxis: per surgeon Code Status: Full Disposition Plan: pending surgeon and PT evaluation   Consultants:   Cherlyn Cushing   Procedures: per ortho   Antimicrobials: \ Subjective: No new complaints reported today  Objective: Vitals:   07/02/16 2216 07/03/16 0244 07/03/16 0549 07/03/16 1000  BP: 124/68 (!) 148/76 136/75 138/68  Pulse: 79 73 70 75  Resp:  16 15 16   Temp:  97.7 F (36.5 C) 98.1 F (36.7 C) 98 F (36.7 C)  TempSrc:  Oral Oral Oral  SpO2: 95% 98% 98% 93%  Weight:      Height:        Intake/Output Summary (Last 24 hours) at 07/03/16 1426 Last data filed at 07/03/16 0708  Gross per 24 hour  Intake          2756.67 ml  Output             1510 ml  Net          1246.67 ml   Filed Weights   07/02/16 0836  Weight: 89.4 kg (197 lb)    Examination:  General exam: Appears calm and comfortable  Respiratory system: equal chest rise, Respiratory effort normal. Cardiovascular system: no cyanosis  Data Reviewed: I have personally reviewed following labs and imaging studies  CBC:  Recent Labs Lab 07/01/16 1929 07/02/16 0525 07/02/16 2035  WBC 12.7* 7.8 9.4  NEUTROABS 9.1*  --   --   HGB 15.1 12.9* 13.4  HCT 43.4 37.9* 40.1  MCV 95.6 95.7 96.4  PLT 158 125* [id number]*   Basic Metabolic Panel:  Recent Labs Lab 07/01/16 1929 07/02/16 0525 07/02/16 2035  NA 139 139  --   K 4.0 3.7  --   CL 104 107  --   CO2 25 25  --   GLUCOSE 117* 121*  --   BUN 23* 20  --    CREATININE 0.91 0.82 0.87  CALCIUM 10.0 8.5*  --    GFR: Estimated Creatinine Clearance: 73.7 mL/min (by C-G formula based on SCr of 0.87 mg/dL). Liver Function Tests: No results for input(s): AST, ALT, ALKPHOS, BILITOT, PROT, ALBUMIN in the last 168 hours. No results for input(s): LIPASE, AMYLASE in the last 168 hours. No results for input(s): AMMONIA in the last 168 hours. Coagulation Profile:  Recent Labs Lab 07/01/16 1942  INR 0.99   Cardiac Enzymes: No results for input(s): CKTOTAL, CKMB, CKMBINDEX, TROPONINI in the last 168 hours. BNP (last 3 results) No results for input(s): PROBNP in the last 8760 hours. HbA1C: No results for input(s): HGBA1C in the last 72 hours. CBG: No results for input(s): GLUCAP in the last 168 hours. Lipid Profile: No results for input(s): CHOL, HDL, LDLCALC, TRIG, CHOLHDL, LDLDIRECT in the last 72 hours. Thyroid Function Tests: No results for input(s): TSH, T4TOTAL, FREET4, T3FREE, THYROIDAB in the last 72 hours. Anemia Panel: No results for input(s): VITAMINB12, FOLATE, FERRITIN, TIBC, IRON, RETICCTPCT in the last 72 hours. Sepsis Labs: No results  for input(s): PROCALCITON, LATICACIDVEN in the last 168 hours.  Recent Results (from the past 240 hour(s))  MRSA PCR Screening     Status: None   Collection Time: 07/02/16 12:50 PM  Result Value Ref Range Status   MRSA by PCR NEGATIVE NEGATIVE Final    Comment:        The GeneXpert MRSA Assay (FDA approved for NASAL specimens only), is one component of a comprehensive MRSA colonization surveillance program. It is not intended to diagnose MRSA infection nor to guide or monitor treatment for MRSA infections.          Radiology Studies: Dg C-arm 1-60 Min  Result Date: 07/02/2016 CLINICAL DATA:  Internal fixation of right femoral neck fracture. Initial encounter. EXAM: OPERATIVE RIGHT HIP (WITH PELVIS IF PERFORMED) 2 VIEWS TECHNIQUE: Fluoroscopic spot image(s) were submitted for  interpretation post-operatively. COMPARISON:  Right hip radiographs performed 07/01/2016 FINDINGS: The patient is status post placement of 2 pins across the right femoral neck fracture, transfixing the fracture in grossly anatomic alignment. No new fractures are seen. The right femoral head remains seated at the acetabulum. IMPRESSION: Status post internal fixation of right femoral neck fracture in grossly anatomic alignment. Electronically Signed   By: Garald Balding M.D.   On: 07/02/2016 19:52   Dg Hip Operative Unilat W Or W/o Pelvis Right  Result Date: 07/02/2016 CLINICAL DATA:  Internal fixation of right femoral neck fracture. Initial encounter. EXAM: OPERATIVE RIGHT HIP (WITH PELVIS IF PERFORMED) 2 VIEWS TECHNIQUE: Fluoroscopic spot image(s) were submitted for interpretation post-operatively. COMPARISON:  Right hip radiographs performed 07/01/2016 FINDINGS: The patient is status post placement of 2 pins across the right femoral neck fracture, transfixing the fracture in grossly anatomic alignment. No new fractures are seen. The right femoral head remains seated at the acetabulum. IMPRESSION: Status post internal fixation of right femoral neck fracture in grossly anatomic alignment. Electronically Signed   By: Garald Balding M.D.   On: 07/02/2016 19:52   Dg Hip Unilat With Pelvis Min 4 Views Right  Result Date: 07/01/2016 CLINICAL DATA:  Recent fall with right leg pain, initial encounter EXAM: DG HIP (WITH OR WITHOUT PELVIS) 4+V RIGHT COMPARISON:  Films from earlier in the same day FINDINGS: There again seen irregularities along the inferior aspect of the right femoral head suggestive of an impacted fracture in the junction of the femoral head and neck. No other bony abnormality is seen. IMPRESSION: Changes suggestive of impacted fracture in the proximal right femur. Electronically Signed   By: Inez Catalina M.D.   On: 07/01/2016 20:09   Dg Femur, Min 2 Views Right  Result Date: 07/01/2016 CLINICAL  DATA:  Slip and fall with right leg pain, initial encounter EXAM: RIGHT FEMUR 2 VIEWS COMPARISON:  03/05/2013 FINDINGS: Irregularity at the junction of the femoral neck and femoral head is noted highly suspicious for an impacted fracture. The remainder of femur is within normal limits. Degenerative changes about the knee joint are seen. No effusion is noted. Vascular calcifications are seen. IMPRESSION: Changes highly suspicious for impacted fracture at the junction of femoral head and femoral neck. Dedicated hip films may be helpful. Electronically Signed   By: Inez Catalina M.D.   On: 07/01/2016 16:54    Scheduled Meds: . amLODipine  5 mg Oral QPM  . bupivacaine (PF)  30 mL Infiltration Once  . docusate sodium  100 mg Oral BID  . enoxaparin (LOVENOX) injection  40 mg Subcutaneous Q24H  . losartan  100 mg Oral  QPM  . senna  1 tablet Oral BID   Continuous Infusions: . lactated ringers 100 mL/hr at 07/02/16 2247     LOS: 2 days    Time spent: 15 min Velvet Bathe, MD [facility] Pager [phone]  If 7PM-7AM, please contact night-coverage [email or web address] Password [email or web address] 07/03/2016, 2:26 PM

## 2016-07-03 NOTE — Op Note (Signed)
07/01/2016 - 07/02/2016  8:12 AM  PATIENT:  Roger Saunders    PRE-OPERATIVE DIAGNOSIS:  Right Hip Fracture  POST-OPERATIVE DIAGNOSIS:  Same  PROCEDURE:  CANNULATED HIP PINNING  SURGEON:  Rimas Gilham, Ernesta Amble, MD  ASSISTANT: Roxan Hockey, PA-C, he was present and scrubbed throughout the case, critical for completion in a timely fashion, and for retraction, instrumentation, and closure.   ANESTHESIA:   General  PREOPERATIVE INDICATIONS:  Roger Saunders is a  83 y.o. male who fell and was found to have a diagnosis of Right Hip Fracture who elected for surgical management.    The risks benefits and alternatives were discussed with the patient preoperatively including but not limited to the risks of infection, bleeding, nerve injury, cardiopulmonary complications, blood clots, malunion, nonunion, avascular necrosis, the need for revision surgery, the potential for conversion to hemiarthroplasty, among others, and the patient was willing to proceed.  OPERATIVE IMPLANTS: 6.5 mm cannulated screws x3  OPERATIVE FINDINGS: Clinical osteoporosis with weak bone, proximal femur  OPERATIVE PROCEDURE: The patient was brought to the operating room and placed in supine position. IV antibiotics were given. General anesthesia administered. Foley was also given. The patient was placed on the fracture table. The operative extremity was positioned, without any significant reduction maneuver and was prepped and draped in usual sterile fashion.  Time out was performed.  Small incisions were made distal to the greater trochanter, and 3 guidewires were introduced Into an inverted triangle configuration. The lengths were measured. The reduction was slightly valgus, and near-anatomic. I opened the cortex with a cannulated drill, and then placed the screws into position. Satisfactory fixation was achieved. I sequentially tightened the screws by hand.  I performed a live fluoroscopic exam and no screw penetrance was  noted. All threads crossed the fracture site.   The wounds were irrigated copiously, and repaired with Vicryl with Steri-Strips and sterile gauze. There no complications and the patient tolerated the procedure well.  The patient will be weightbearing as tolerated, VTE prophylaxis will be: chemical px and mobilization

## 2016-07-03 NOTE — Progress Notes (Signed)
Pt complained of acid reflux. NP was paged for TUMS. TUMS was ordered and given to pt. Will continue to monitor.

## 2016-07-03 NOTE — Evaluation (Signed)
Physical Therapy Evaluation Patient Details Name: Roger Saunders MRN: [id number] DOB: 1932-06-02 Today's Date: 07/03/2016   History of Present Illness  83 y.o. 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  Presented to ED where XR suggestive of impacted fracture in the proximal right femur.  Pt underwent pinning of R hip and s/p WBAT   Clinical Impression  Pt admitted with above diagnosis. Pt currently with functional limitations due to the deficits listed below (see PT Problem List). Pt currently requires overall min assist with mobility using RW limited by pain and endurance. Pt will benefit from skilled PT to increase their independence and safety with mobility to allow discharge to the venue listed below.  Plan to d/c to friends home (pt lives alone) as long as making enough progress, otherwise pt recognizes he will need short term rehab. Pt motivated and anticipate pt will be able to d/c with HHPT.     Follow Up Recommendations Home health PT;Supervision for mobility/OOB    Equipment Recommendations  Rolling walker with 5" wheels    Recommendations for Other Services       Precautions / Restrictions Precautions Precautions: Fall Restrictions Weight Bearing Restrictions: No Other Position/Activity Restrictions: WBAT RLE      Mobility  Bed Mobility Overal bed mobility: Needs Assistance Bed Mobility: Supine to Sit     Supine to sit: Min assist;HOB elevated     General bed mobility comments: pt using rails for support and required min assist to initiate movement in RLE and min assist at trunk to come up to seated position  Transfers Overall transfer level: Needs assistance Equipment used: Rolling walker (2 wheeled) Transfers: Sit to/from [facility] Sit to Stand: Min assist Stand pivot transfers: Min assist       General transfer comment: cues for hand placement and technique; decreased weightbearing  initially on RLE due to pain  Ambulation/Gait Ambulation/Gait assistance: Min assist Ambulation Distance (Feet): 12 Feet Assistive device: Rolling walker (2 wheeled) Gait Pattern/deviations: Step-to pattern;Decreased stance time - right;Antalgic     General Gait Details: weightbearing on RLE increasing as gait progressed. cues for upright posture  Stairs            Wheelchair Mobility    Modified Rankin (Stroke Patients Only)       Balance Overall balance assessment: Needs assistance;History of Falls Sitting-balance support: Single extremity supported;No upper extremity supported;Feet supported Sitting balance-Leahy Scale: Good     Standing balance support: Bilateral upper extremity supported;During functional activity Standing balance-Leahy Scale: Poor Standing balance comment: Pt requires UE support on RW                             Pertinent Vitals/Pain Pain Assessment: 0-10 Pain Score: 3  Pain Location: RLE Pain Descriptors / Indicators: Sore Pain Intervention(s): Limited activity within patient's tolerance;Monitored during session;Premedicated before session    Home Living Family/patient expects to be discharged to:: Private residence Living Arrangements: Other (Comment) (Friend) Available Help at Discharge: Friend(s);Available 24 hours/day Type of Home: House Home Access: Stairs to enter Entrance Stairs-Rails: None Entrance Stairs-Number of Steps: 1 Home Layout: Two level;Able to live on main level with bedroom/bathroom   Additional Comments: Pt lives alone but planning to d/c to friends home who can provide assist as needed. Pt motivated t d/c home but agreeable to SNF if he does not progress well enough over the next few  days    Prior Function Level of Independence: Independent         Comments: Pt active and working 2 jobs     Journalist, newspaper        Extremity/Trunk Assessment   Upper Extremity Assessment Upper Extremity  Assessment: Defer to OT evaluation    Lower Extremity Assessment Lower Extremity Assessment: RLE deficits/detail RLE Deficits / Details: grossly 3- to 3/5; limted by post op pain and weakness    Cervical / Trunk Assessment Cervical / Trunk Assessment: Normal  Communication   Communication: No difficulties  Cognition Arousal/Alertness: Awake/alert Behavior During Therapy: WFL for tasks assessed/performed Overall Cognitive Status: Within Functional Limits for tasks assessed                      General Comments General comments (skin integrity, edema, etc.): education on edema control in RLE    Exercises General Exercises - Lower Extremity Long Arc Quad: AROM;Strengthening;Right;5 reps;Seated Hip Flexion/Marching: AROM;Strengthening;Right;5 reps;Seated Toe Raises: AROM;Strengthening;Both;5 reps;Seated Heel Raises: AROM;Strengthening;Both;5 reps;Seated   Assessment/Plan    PT Assessment Patient needs continued PT services  PT Problem List Decreased strength;Decreased range of motion;Decreased activity tolerance;Decreased balance;Decreased mobility;Decreased knowledge of use of DME;Pain          PT Treatment Interventions DME instruction;Gait training;Stair training;Functional mobility training;Therapeutic activities;Therapeutic exercise;Balance training;Neuromuscular re-education;Patient/family education;Modalities    PT Goals (Current goals can be found in the Care Plan section)  Acute Rehab PT Goals Patient Stated Goal: go home PT Goal Formulation: With patient Time For Goal Achievement: 07/10/16 Potential to Achieve Goals: Good    Frequency Min 4X/week   Barriers to discharge   planning to go home with friend if able to complete household mobility and gait    Co-evaluation               End of Session Equipment Utilized During Treatment: Gait belt Activity Tolerance: Patient tolerated treatment well (mild lightheadedness with initial EOB and stand  but resolved) Patient left: in chair;with call bell/phone within reach;with family/visitor present Nurse Communication: Mobility status         Time: [id number] PT Time Calculation (min) (ACUTE ONLY): 27 min   Charges:   PT Evaluation $PT Eval Moderate Complexity: 1 Procedure PT Treatments $Therapeutic Activity: 8-22 mins   PT G Codes:        Canary Brim Ivory Broad, PT, DPT Pager #: [phone]  07/03/2016, 9:51 AM

## 2016-07-03 NOTE — Progress Notes (Signed)
CM called [place] @ [place], [phone] ext.[phone] regarding workmen's compensation claim. [place] made CM aware claim is in process and will f/u with CM claim information. Whitman Hero RN,BSN,CM [phone]

## 2016-07-04 ENCOUNTER — Encounter (HOSPITAL_COMMUNITY): Payer: Self-pay | Admitting: Orthopedic Surgery

## 2016-07-04 DIAGNOSIS — S72001S Fracture of unspecified part of neck of right femur, sequela: Secondary | ICD-10-CM

## 2016-07-04 NOTE — Progress Notes (Signed)
PROGRESS NOTE    Roger Saunders  [id number] DOB: [birth year] DOA: 07/01/2016 PCP: No primary care provider on file.   Brief Narrative:  83 y/o presenting after fall with fx of femoral head  Assessment & Plan:   Principal Problem:   Closed right hip fracture ([place]) - Ortho on board. And patient is s/p day 2 cannulated hip pinning (right) - supportive therapy - PT evaluation  Active Problems:   Essential hypertension - stable continue amlodipine and losartan   Arthritis   GERD (gastroesophageal reflux disease)   Fall   DVT prophylaxis: per surgeon Code Status: Full Disposition Plan: Plan is to d/c tomorrow 07/05/16 with home health services.   Consultants:   Cherlyn Cushing   Procedures: per ortho   Antimicrobials: \ Subjective: No new complaints reported today  Objective: Vitals:   07/03/16 1534 07/03/16 2115 07/04/16 0039 07/04/16 0521  BP: 135/65 (!) 131/59 138/60 (!) 142/85  Pulse: 79 72 72 66  Resp: 18 18    Temp: 98.3 F (36.8 C) 98 F (36.7 C) 98.4 F (36.9 C) 98.1 F (36.7 C)  TempSrc:  Oral Oral Oral  SpO2: 96% 95% 94% 92%  Weight:      Height:        Intake/Output Summary (Last 24 hours) at 07/04/16 1359 Last data filed at 07/04/16 1000  Gross per 24 hour  Intake             1200 ml  Output              800 ml  Net              400 ml   Filed Weights   07/02/16 0836  Weight: 89.4 kg (197 lb)    Examination:  General exam: Appears calm and comfortable  Respiratory system: equal chest rise, Respiratory effort normal. Cardiovascular system: no cyanosis  Data Reviewed: I have personally reviewed following labs and imaging studies  CBC:  Recent Labs Lab 07/01/16 1929 07/02/16 0525 07/02/16 2035  WBC 12.7* 7.8 9.4  NEUTROABS 9.1*  --   --   HGB 15.1 12.9* 13.4  HCT 43.4 37.9* 40.1  MCV 95.6 95.7 96.4  PLT 158 125* [id number]*   Basic Metabolic Panel:  Recent Labs Lab 07/01/16 1929 07/02/16 0525 07/02/16 2035  NA 139 139   --   K 4.0 3.7  --   CL 104 107  --   CO2 25 25  --   GLUCOSE 117* 121*  --   BUN 23* 20  --   CREATININE 0.91 0.82 0.87  CALCIUM 10.0 8.5*  --    GFR: Estimated Creatinine Clearance: 73.7 mL/min (by C-G formula based on SCr of 0.87 mg/dL). Liver Function Tests: No results for input(s): AST, ALT, ALKPHOS, BILITOT, PROT, ALBUMIN in the last 168 hours. No results for input(s): LIPASE, AMYLASE in the last 168 hours. No results for input(s): AMMONIA in the last 168 hours. Coagulation Profile:  Recent Labs Lab 07/01/16 1942  INR 0.99   Cardiac Enzymes: No results for input(s): CKTOTAL, CKMB, CKMBINDEX, TROPONINI in the last 168 hours. BNP (last 3 results) No results for input(s): PROBNP in the last 8760 hours. HbA1C: No results for input(s): HGBA1C in the last 72 hours. CBG: No results for input(s): GLUCAP in the last 168 hours. Lipid Profile: No results for input(s): CHOL, HDL, LDLCALC, TRIG, CHOLHDL, LDLDIRECT in the last 72 hours. Thyroid Function Tests: No results for input(s): TSH, T4TOTAL, FREET4, T3FREE,  THYROIDAB in the last 72 hours. Anemia Panel: No results for input(s): VITAMINB12, FOLATE, FERRITIN, TIBC, IRON, RETICCTPCT in the last 72 hours. Sepsis Labs: No results for input(s): PROCALCITON, LATICACIDVEN in the last 168 hours.  Recent Results (from the past 240 hour(s))  MRSA PCR Screening     Status: None   Collection Time: 07/02/16 12:50 PM  Result Value Ref Range Status   MRSA by PCR NEGATIVE NEGATIVE Final    Comment:        The GeneXpert MRSA Assay (FDA approved for NASAL specimens only), is one component of a comprehensive MRSA colonization surveillance program. It is not intended to diagnose MRSA infection nor to guide or monitor treatment for MRSA infections.          Radiology Studies: Dg C-arm 1-60 Min  Result Date: 07/02/2016 CLINICAL DATA:  Internal fixation of right femoral neck fracture. Initial encounter. EXAM: OPERATIVE RIGHT  HIP (WITH PELVIS IF PERFORMED) 2 VIEWS TECHNIQUE: Fluoroscopic spot image(s) were submitted for interpretation post-operatively. COMPARISON:  Right hip radiographs performed 07/01/2016 FINDINGS: The patient is status post placement of 2 pins across the right femoral neck fracture, transfixing the fracture in grossly anatomic alignment. No new fractures are seen. The right femoral head remains seated at the acetabulum. IMPRESSION: Status post internal fixation of right femoral neck fracture in grossly anatomic alignment. Electronically Signed   By: Garald Balding M.D.   On: 07/02/2016 19:52   Dg Hip Operative Unilat W Or W/o Pelvis Right  Result Date: 07/02/2016 CLINICAL DATA:  Internal fixation of right femoral neck fracture. Initial encounter. EXAM: OPERATIVE RIGHT HIP (WITH PELVIS IF PERFORMED) 2 VIEWS TECHNIQUE: Fluoroscopic spot image(s) were submitted for interpretation post-operatively. COMPARISON:  Right hip radiographs performed 07/01/2016 FINDINGS: The patient is status post placement of 2 pins across the right femoral neck fracture, transfixing the fracture in grossly anatomic alignment. No new fractures are seen. The right femoral head remains seated at the acetabulum. IMPRESSION: Status post internal fixation of right femoral neck fracture in grossly anatomic alignment. Electronically Signed   By: Garald Balding M.D.   On: 07/02/2016 19:52    Scheduled Meds: . amLODipine  5 mg Oral QPM  . bupivacaine (PF)  30 mL Infiltration Once  . docusate sodium  100 mg Oral BID  . enoxaparin (LOVENOX) injection  40 mg Subcutaneous Q24H  . losartan  100 mg Oral QPM  . senna  1 tablet Oral BID   Continuous Infusions:    LOS: 3 days    Time spent: 15 min Velvet Bathe, MD [facility] Pager [phone]  If 7PM-7AM, please contact night-coverage [email or web address] Password [email or web address] 07/04/2016, 1:59 PM

## 2016-07-04 NOTE — Progress Notes (Signed)
Physical Therapy Treatment Patient Details Name: Roger Saunders MRN: [id number] DOB: 1931-07-06 Today's Date: 07/04/2016    History of Present Illness 83 y.o. 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  Presented to ED where XR suggestive of impacted fracture in the proximal right femur.  Pt underwent pinning of R hip and s/p WBAT     PT Comments    Pt eager to participate with therapy today to prepare for going home tomorrow. Pt able to walk with no increased level of pain; c/o of constant pain throughout treatment but stated it was tolerable and felt better when walking. Reiterated to patient benefit of moving throughout the day to reduce pain and soreness. Pt able to safely ambulate with min guard but continues to present with limp for pain. He is expected to be d/c tomorrow to friend's home since he lives alone.    Follow Up Recommendations  Home health PT;Supervision for mobility/OOB     Equipment Recommendations  Rolling walker with 5" wheels    Recommendations for Other Services       Precautions / Restrictions Precautions Precautions: Fall Restrictions Weight Bearing Restrictions: Yes RLE Weight Bearing: Weight bearing as tolerated Other Position/Activity Restrictions: WBAT RLE    Mobility  Bed Mobility Overal bed mobility: Modified Independent;Needs Assistance Bed Mobility: Supine to Sit     Supine to sit: Supervision;HOB elevated     General bed mobility comments: Mod use of bed rails to scoot up in bed and to get out. Pt able to manage BLEs without assistance  Transfers Overall transfer level: Needs assistance Equipment used: Rolling walker (2 wheeled) Transfers: Sit to/from Stand Sit to Stand: Min guard;Min assist         General transfer comment: Min Assist for initiation of trunk OOB but reduced to min guard for safety. Cues for hand placement and to put more weight on R  LE.  Ambulation/Gait Ambulation/Gait assistance: Min guard Ambulation Distance (Feet): 100 Feet Assistive device: Rolling walker (2 wheeled) Gait Pattern/deviations: Antalgic;Decreased stance time - right;Step-through pattern;Decreased step length - left;Decreased weight shift to right;Narrow base of support Gait velocity: decreased   General Gait Details: cues to widen BOS and accept more weight on R leg. Pt reported limp was due to pain and unable to self correct    Stairs            Wheelchair Mobility    Modified Rankin (Stroke Patients Only)       Balance Overall balance assessment: Needs assistance;History of Falls Sitting-balance support: No upper extremity supported;Feet supported Sitting balance-Leahy Scale: Good     Standing balance support: Bilateral upper extremity supported;During functional activity Standing balance-Leahy Scale: Fair Standing balance comment: Pt able to stand at sink during grooming tasks with no UE support and supervision/min guard assist from therapist                     Cognition Arousal/Alertness: Awake/alert Behavior During Therapy: WFL for tasks assessed/performed Overall Cognitive Status: Within Functional Limits for tasks assessed                      Exercises General Exercises - Lower Extremity Quad Sets: Strengthening;Right;10 reps;Supine Long Arc Quad: AROM;Strengthening;Right;Seated;10 reps Heel Slides: AROM;Strengthening;Right;10 reps;Supine Hip Flexion/Marching: AROM;Strengthening;Seated;10 reps;Both Heel Raises: AROM;Strengthening;Both;Seated;10 reps    General Comments        Pertinent Vitals/Pain Pain Assessment: Faces Pain Score: 6  Faces  Pain Scale: Hurts a little bit Pain Location: RLE Pain Descriptors / Indicators: Sore Pain Intervention(s): Monitored during session;Repositioned    Home Living                      Prior Function            PT Goals (current goals can now  be found in the care plan section) Acute Rehab PT Goals Patient Stated Goal: go home PT Goal Formulation: With patient Potential to Achieve Goals: Good Progress towards PT goals: Progressing toward goals    Frequency    Min 4X/week      PT Plan Current plan remains appropriate    Co-evaluation             End of Session Equipment Utilized During Treatment: Gait belt Activity Tolerance: Patient tolerated treatment well (pt experienced dizziness from sit to supine but quickly subsided) Patient left: with call bell/phone within reach;in bed     Time: 1415-1440 PT Time Calculation (min) (ACUTE ONLY): 25 min  Charges:  $Gait Training: 8-22 mins $Therapeutic Exercise: 8-22 mins                    G Codes:      [facility] 2016/07/27, 3:06 PM Olena [place], [place] Pager [phone]

## 2016-07-04 NOTE — Progress Notes (Signed)
   Assessment: 2 Days Post-Op  S/P Procedure(s) (LRB): CANNULATED HIP PINNING (Right) by Dr. Ernesta Amble. Murphy on 07/02/16  Principal Problem:   Closed right hip fracture [facility]) Active Problems:   Essential hypertension   Arthritis   GERD (gastroesophageal reflux disease)   Fall  Progressing well from an orthopedic perspective.  Pain controlled.  OOB walking some with therapy.  Plan: Advance diet Up with therapy D/C IV fluids  Weight Bearing: Weight Bearing as Tolerated (WBAT)  Dressings: prn.  VTE prophylaxis: Aspirin, SCDs, ambulation Dispo: Plan for d/c to neighbor / friend's home tomorrow 07/05/16 with Home health services.  Subjective: Patient reports pain as mild. Pain controlled with PO meds.  Tolerating diet.  Urinating.  No CP, SOB. Not yet OOB.  Objective:   VITALS:   Vitals:   07/03/16 1534 07/03/16 2115 07/04/16 0039 07/04/16 0521  BP: 135/65 (!) 131/59 138/60 (!) 142/85  Pulse: 79 72 72 66  Resp: 18 18    Temp: 98.3 F (36.8 C) 98 F (36.7 C) 98.4 F (36.9 C) 98.1 F (36.7 C)  TempSrc:  Oral Oral Oral  SpO2: 96% 95% 94% 92%  Weight:      Height:       CBC Latest Ref Rng & Units 07/02/2016 07/02/2016 07/01/2016  WBC 4.0 - 10.5 K/uL 9.4 7.8 12.7(H)  Hemoglobin 13.0 - 17.0 g/dL 13.4 12.9(L) 15.1  Hematocrit 39.0 - 52.0 % 40.1 37.9(L) 43.4  Platelets 150 - 400 K/uL 131(L) 125(L) 158   BMP Latest Ref Rng & Units 07/02/2016 07/02/2016 07/01/2016  Glucose 65 - 99 mg/dL - 121(H) 117(H)  BUN 6 - 20 mg/dL - 20 23(H)  Creatinine 0.61 - 1.24 mg/dL 0.87 0.82 0.91  Sodium 135 - 145 mmol/L - 139 139  Potassium 3.5 - 5.1 mmol/L - 3.7 4.0  Chloride 101 - 111 mmol/L - 107 104  CO2 22 - 32 mmol/L - 25 25  Calcium 8.9 - 10.3 mg/dL - 8.5(L) 10.0   Intake/Output      01/24 0701 - 01/25 0700 01/25 0701 - 01/26 0700   P.O.     I.V. (mL/kg) 1200 (13.4)    IV Piggyback     Total Intake(mL/kg) 1200 (13.4)    Urine (mL/kg/hr) 600 (0.3)    Blood     Total Output 600       Net +600           Physical Exam: General: NAD.  Upright in bed.  Talkative. MSK RLE: Neurovascularly intact Sensation intact distally Dorsiflexion/Plantar flexion intact Incision: dressing C/D/I  Prudencio Burly III, PA-C 07/04/2016, 9:54 AM

## 2016-07-04 NOTE — Progress Notes (Signed)
Occupational Therapy Treatment Patient Details Name: Roger Saunders MRN: [id number] DOB: 1931-10-21 Today's Date: 07/04/2016    History of present illness 83 y.o. 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  Presented to ED where XR suggestive of impacted fracture in the proximal right femur.  Pt underwent pinning of R hip and s/p WBAT    OT comments  Pt making great progress towards OT goals, continue plan of care for now. Continue to recommend HHOT along with a BSC and tub transfer bench. Pt states he plans to use the [facility] as a shower seat. See below under ADL for more information regarding this skilled session.    Follow Up Recommendations  Home health OT;Supervision/Assistance - 24 hour (initially)    Equipment Recommendations  3 in 1 bedside commode;Tub/shower bench;Other (comment) (AE (sock aid and LH sponge))    Recommendations for Other Services  None at this time   Precautions / Restrictions Precautions Precautions: Fall Restrictions Weight Bearing Restrictions: Yes RLE Weight Bearing: Weight bearing as tolerated    Mobility Bed Mobility Overal bed mobility: Needs Assistance Bed Mobility: Supine to Sit     Supine to sit: Supervision;HOB elevated     General bed mobility comments: Heavy use of bed rails. Pt able to manage BLEs during bed mobility.   Transfers Overall transfer level: Needs assistance Equipment used: Rolling walker (2 wheeled) Transfers: Sit to/from Stand Sit to Stand: Min guard         General transfer comment: Min guard for safety. Cues for hand placement and technique.     Balance Overall balance assessment: Needs assistance;History of Falls Sitting-balance support: No upper extremity supported;Feet supported Sitting balance-Leahy Scale: Good     Standing balance support: Bilateral upper extremity supported;During functional activity Standing balance-Leahy Scale: Fair Standing balance  comment: Pt able to stand at sink during grooming tasks with no UE support and supervision/min guard assist from therapist    ADL Overall ADL's : Needs assistance/impaired Eating/Feeding: Set up;Sitting   Grooming: Wash/dry hands;Wash/dry face;Oral care;Supervision/safety;Standing;Min guard   Upper Body Bathing: Set up;Sitting   Lower Body Bathing: Maximal assistance;Sit to/from stand Lower Body Bathing Details (indicate cue type and reason): increased with use of AE Upper Body Dressing : Set up;Sitting   Lower Body Dressing: Maximal assistance;Sit to/from stand Lower Body Dressing Details (indicate cue type and reason): increased with use of AE Toilet Transfer: Ambulation;RW;Comfort height toilet;Min guard Toilet Transfer Details (indicate cue type and reason): Discussed use of BSC for toilet transfers safety Toileting- Clothing Manipulation and Hygiene: Minimal assistance;Sit to/from stand     Tub/Shower Transfer Details (indicate cue type and reason): encouraged pt to wait for [place] before attempting tub/shower transfer at home. Functional mobility during ADLs: Min guard;Rolling walker General ADL Comments: Discussed use of AE and pt stated he has a reacher and LH shoe horn at home. Pt states he will have assistance post acute d/c and they will be able to assist with ADLs prn.            Cognition   Behavior During Therapy: WFL for tasks assessed/performed Overall Cognitive Status: Within Functional Limits for tasks assessed                 Pertinent Vitals/ Pain       Pain Assessment: 0-10 Pain Score: 6  Pain Location: RLE Pain Descriptors / Indicators: Sore Pain Intervention(s): Monitored during session;Repositioned  Frequency  Min 2X/week      Progress Toward Goals  OT Goals(current goals can now be found in the care plan section)  Progress towards OT goals: Progressing toward goals  Acute Rehab OT Goals Patient Stated Goal: go home OT Goal  Formulation: With patient/family Time For Goal Achievement: 07/10/16 Potential to Achieve Goals: Good  Plan Discharge plan remains appropriate    End of Session Equipment Utilized During Treatment: Gait belt;Rolling walker   Activity Tolerance Patient tolerated treatment well   Patient Left in chair;with call bell/phone within reach     Time: 1040-1108 OT Time Calculation (min): 28 min  Charges: OT General Charges $OT Visit: 1 Procedure OT Treatments $Self Care/Home Management : 23-37 mins  Chrys Racer , MS, OTR/L, CLT Pager: [phone]  07/04/2016, 11:18 AM

## 2016-07-05 DIAGNOSIS — S72001D Fracture of unspecified part of neck of right femur, subsequent encounter for closed fracture with routine healing: Secondary | ICD-10-CM

## 2016-07-05 NOTE — Progress Notes (Signed)
Roger Saunders discharged Home with family, H/H PT and a rolling walker per MD order.  Discharge instructions reviewed and discussed with the patient and pt. daughter, all questions and concerns answered. Copy of instructions, care notes for new diagnosis and medications and scripts given to patient.  Allergies as of 07/05/2016   No Known Allergies     Medication List    STOP taking these medications   naproxen sodium 220 MG tablet Commonly known as:  ANAPROX     TAKE these medications   amLODipine 5 MG tablet Commonly known as:  NORVASC Take 5 mg by mouth every evening.   aspirin EC 325 MG tablet Take 1 tablet (325 mg total) by mouth daily. For 30 days post op for DVT Prophylaxis   baclofen 10 MG tablet Commonly known as:  LIORESAL Take 1 tablet (10 mg total) by mouth 3 (three) times daily as needed for muscle spasms.   docusate sodium 100 MG capsule Commonly known as:  COLACE Take 1 capsule (100 mg total) by mouth 2 (two) times daily. To prevent constipation while taking pain medication.   esomeprazole 20 MG packet Commonly known as:  NEXIUM Take 20 mg by mouth daily as needed (heartburn/acid reflux).   HYDROcodone-acetaminophen 5-325 MG tablet Commonly known as:  NORCO Take 1-2 tablets by mouth every 4 (four) hours as needed for moderate pain.   losartan 100 MG tablet Commonly known as:  COZAAR Take 100 mg by mouth every evening.   omeprazole 20 MG capsule Commonly known as:  PRILOSEC Take 1 capsule (20 mg total) by mouth daily. While taking anti inflammatory medicine daily            Durable Medical Equipment        Start     Ordered   07/05/16 [phone]  For home use only DME Gilford Rile  [facility])  Once    Question:  Patient needs a walker to treat with the following condition  Answer:  Femur fracture ([place])   07/05/16 0834   07/03/16 1252  For home use only DME Walker rolling  Once    Question:  Patient needs a walker to treat with the following condition   Answer:  Hip fx, left, closed, initial encounter ([place])   07/03/16 1251   07/03/16 1251  For home use only DME Shower stool  Once     07/03/16 1251   07/03/16 1248  For home use only DME 3 n 1  Once     07/03/16 1248      . IV site discontinued and catheter remains intact. Site without signs and symptoms of complications. Dressing and pressure applied.  Patient escorted to car by volunteer in a wheelchair,  no distress noted upon discharge.  Wynetta Emery, Kagan Mutchler C 07/05/2016 12:11 PM
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
## 2016-07-05 NOTE — Discharge Summary (Signed)
Physician Discharge Summary  Roger Saunders [id number] DOB: Feb 25, 1932 DOA: 07/01/2016  PCP: No primary care provider on file.  Admit date: 07/01/2016 Discharge date: 07/05/2016  Time spent: > 35 minutes  Recommendations for Outpatient Follow-up:  1. Patient will have [facility] PT   Discharge Diagnoses:  Principal Problem:   Closed right hip fracture [facility]) Active Problems:   Essential hypertension   Arthritis   GERD (gastroesophageal reflux disease)   Fall   Discharge Condition: stable  Diet recommendation: low sodium diet  Filed Weights   07/02/16 0836  Weight: 89.4 kg (197 lb)    History of present illness:  83 y/o presenting after fall with fx of femoral head  Hospital Course:  Principal Problem:   Closed right hip fracture ([place]) - Ortho on board. And patient is s/p day 3 cannulated hip pinning (right) - supportive therapy - PT evaluation  Active Problems:   Essential hypertension - stable continue amlodipine and losartan   Arthritis   GERD (gastroesophageal reflux disease) - stable   Fall  Procedures:  As listed above  Consultations:  Ortho  Discharge Exam: Vitals:   07/04/16 2100 07/05/16 0537  BP: 134/71 (!) 142/65  Pulse: 61 (!) 59  Resp: 16 16  Temp: 98.2 F (36.8 C) 98 F (36.7 C)    General: Pt in nad, alert and awake Cardiovascular: rrr, no rubs Respiratory: no increased wob, no wheezes  Discharge Instructions   Discharge Instructions    Diet - low sodium heart healthy    Complete by:  As directed    Discharge instructions    Complete by:  As directed    Please f/u with surgeon in 2 wks. Call their office after hospital discharge.   Increase activity slowly    Complete by:  As directed      Current Discharge Medication List    START taking these medications   Details  aspirin EC 325 MG tablet Take 1 tablet (325 mg total) by mouth daily. For 30 days post op for DVT Prophylaxis Qty: 30 tablet, Refills: 0    baclofen  (LIORESAL) 10 MG tablet Take 1 tablet (10 mg total) by mouth 3 (three) times daily as needed for muscle spasms. Qty: 40 each, Refills: 0    docusate sodium (COLACE) 100 MG capsule Take 1 capsule (100 mg total) by mouth 2 (two) times daily. To prevent constipation while taking pain medication. Qty: 60 capsule, Refills: 0    HYDROcodone-acetaminophen (NORCO) 5-325 MG tablet Take 1-2 tablets by mouth every 4 (four) hours as needed for moderate pain. Qty: 40 tablet, Refills: 0    omeprazole (PRILOSEC) 20 MG capsule Take 1 capsule (20 mg total) by mouth daily. While taking anti inflammatory medicine daily Qty: 30 capsule, Refills: 2      CONTINUE these medications which have NOT CHANGED   Details  amLODipine (NORVASC) 5 MG tablet Take 5 mg by mouth every evening.     esomeprazole (NEXIUM) 20 MG packet Take 20 mg by mouth daily as needed (heartburn/acid reflux).    losartan (COZAAR) 100 MG tablet Take 100 mg by mouth every evening.       STOP taking these medications     naproxen sodium (ANAPROX) 220 MG tablet        No Known Allergies Follow-up Information    MURPHY, TIMOTHY D, MD Follow up in 2 week(s).   Specialty:  Orthopedic Surgery Contact information: [address] [place] [address] [phone]  [place] Follow up.   Why:  Rolling walker, 3 in 1/bedside commode to be delivered to bedside prior to discharge. Shower /tub chair to be picked up at [employer]. Contact information: [address] [place] [address] [phone]            The results of significant diagnostics from this hospitalization (including imaging, microbiology, ancillary and laboratory) are listed below for reference.    Significant Diagnostic Studies: Dg C-arm 1-60 Min  Result Date: 07/02/2016 CLINICAL DATA:  Internal fixation of right femoral neck fracture. Initial encounter. EXAM: OPERATIVE RIGHT HIP (WITH PELVIS IF  PERFORMED) 2 VIEWS TECHNIQUE: Fluoroscopic spot image(s) were submitted for interpretation post-operatively. COMPARISON:  Right hip radiographs performed 07/01/2016 FINDINGS: The patient is status post placement of 2 pins across the right femoral neck fracture, transfixing the fracture in grossly anatomic alignment. No new fractures are seen. The right femoral head remains seated at the acetabulum. IMPRESSION: Status post internal fixation of right femoral neck fracture in grossly anatomic alignment. Electronically Signed   By: Garald Balding M.D.   On: 07/02/2016 19:52   Dg Hip Operative Unilat W Or W/o Pelvis Right  Result Date: 07/02/2016 CLINICAL DATA:  Internal fixation of right femoral neck fracture. Initial encounter. EXAM: OPERATIVE RIGHT HIP (WITH PELVIS IF PERFORMED) 2 VIEWS TECHNIQUE: Fluoroscopic spot image(s) were submitted for interpretation post-operatively. COMPARISON:  Right hip radiographs performed 07/01/2016 FINDINGS: The patient is status post placement of 2 pins across the right femoral neck fracture, transfixing the fracture in grossly anatomic alignment. No new fractures are seen. The right femoral head remains seated at the acetabulum. IMPRESSION: Status post internal fixation of right femoral neck fracture in grossly anatomic alignment. Electronically Signed   By: Garald Balding M.D.   On: 07/02/2016 19:52   Dg Hip Unilat With Pelvis Min 4 Views Right  Result Date: 07/01/2016 CLINICAL DATA:  Recent fall with right leg pain, initial encounter EXAM: DG HIP (WITH OR WITHOUT PELVIS) 4+V RIGHT COMPARISON:  Films from earlier in the same day FINDINGS: There again seen irregularities along the inferior aspect of the right femoral head suggestive of an impacted fracture in the junction of the femoral head and neck. No other bony abnormality is seen. IMPRESSION: Changes suggestive of impacted fracture in the proximal right femur. Electronically Signed   By: Inez Catalina M.D.   On: 07/01/2016  20:09   Dg Femur, Min 2 Views Right  Result Date: 07/01/2016 CLINICAL DATA:  Slip and fall with right leg pain, initial encounter EXAM: RIGHT FEMUR 2 VIEWS COMPARISON:  03/05/2013 FINDINGS: Irregularity at the junction of the femoral neck and femoral head is noted highly suspicious for an impacted fracture. The remainder of femur is within normal limits. Degenerative changes about the knee joint are seen. No effusion is noted. Vascular calcifications are seen. IMPRESSION: Changes highly suspicious for impacted fracture at the junction of femoral head and femoral neck. Dedicated hip films may be helpful. Electronically Signed   By: Inez Catalina M.D.   On: 07/01/2016 16:54    Microbiology: Recent Results (from the past 240 hour(s))  MRSA PCR Screening     Status: None   Collection Time: 07/02/16 12:50 PM  Result Value Ref Range Status   MRSA by PCR NEGATIVE NEGATIVE Final    Comment:        The GeneXpert MRSA Assay (FDA approved for NASAL specimens only), is one component of a comprehensive MRSA  colonization surveillance program. It is not intended to diagnose MRSA infection nor to guide or monitor treatment for MRSA infections.      Labs: Basic Metabolic Panel:  Recent Labs Lab 07/01/16 1929 07/02/16 0525 07/02/16 2035  NA 139 139  --   K 4.0 3.7  --   CL 104 107  --   CO2 25 25  --   GLUCOSE 117* 121*  --   BUN 23* 20  --   CREATININE 0.91 0.82 0.87  CALCIUM 10.0 8.5*  --    Liver Function Tests: No results for input(s): AST, ALT, ALKPHOS, BILITOT, PROT, ALBUMIN in the last 168 hours. No results for input(s): LIPASE, AMYLASE in the last 168 hours. No results for input(s): AMMONIA in the last 168 hours. CBC:  Recent Labs Lab 07/01/16 1929 07/02/16 0525 07/02/16 2035  WBC 12.7* 7.8 9.4  NEUTROABS 9.1*  --   --   HGB 15.1 12.9* 13.4  HCT 43.4 37.9* 40.1  MCV 95.6 95.7 96.4  PLT 158 125* 131*   Cardiac Enzymes: No results for input(s): CKTOTAL, CKMB,  CKMBINDEX, TROPONINI in the last 168 hours. BNP: BNP (last 3 results) No results for input(s): BNP in the last 8760 hours.  ProBNP (last 3 results) No results for input(s): PROBNP in the last 8760 hours.  CBG: No results for input(s): GLUCAP in the last 168 hours.  Signed:  Velvet Bathe MD.  [facility] 07/05/2016, 9:11 AM

## 2016-07-05 NOTE — Progress Notes (Signed)
   Assessment: 3 Days Post-Op  S/P Procedure(s) (LRB): CANNULATED HIP PINNING (Right) by Dr. Ernesta Amble. Murphy on 07/02/16  Principal Problem:   Closed right hip fracture [facility]) Active Problems:   Essential hypertension   Arthritis   GERD (gastroesophageal reflux disease)   Fall  Doing well from an orthopedic perspective.  Pain well controlled.  OOB walking with therapy.  Plan: Up with therapy  Weight Bearing: Weight Bearing as Tolerated (WBAT)  Dressings: prn.  VTE prophylaxis: Aspirin, SCDs, ambulation Dispo: Plan for d/c to neighbor / friend's home today 07/05/16 with Home health services.  Follow up in the office with Dr. Alain Marion in 2 weeks.  Please call with questions.  Subjective: Feeling good today.  Patient reports pain as mild. Pain controlled with PO meds.  Tolerating diet.  Urinating.  No CP, SOB.   Objective:   VITALS:   Vitals:   07/04/16 0521 07/04/16 1500 07/04/16 2100 07/05/16 0537  BP: (!) 142/85 139/63 134/71 (!) 142/65  Pulse: 66 66 61 (!) 59  Resp:  17 16 16   Temp: 98.1 F (36.7 C) 98.3 F (36.8 C) 98.2 F (36.8 C) 98 F (36.7 C)  TempSrc: Oral Oral Oral Oral  SpO2: 92% 96% 98% 96%  Weight:      Height:       CBC Latest Ref Rng & Units 07/02/2016 07/02/2016 07/01/2016  WBC 4.0 - 10.5 K/uL 9.4 7.8 12.7(H)  Hemoglobin 13.0 - 17.0 g/dL 13.4 12.9(L) 15.1  Hematocrit 39.0 - 52.0 % 40.1 37.9(L) 43.4  Platelets 150 - 400 K/uL 131(L) 125(L) 158   BMP Latest Ref Rng & Units 07/02/2016 07/02/2016 07/01/2016  Glucose 65 - 99 mg/dL - 121(H) 117(H)  BUN 6 - 20 mg/dL - 20 23(H)  Creatinine 0.61 - 1.24 mg/dL 0.87 0.82 0.91  Sodium 135 - 145 mmol/L - 139 139  Potassium 3.5 - 5.1 mmol/L - 3.7 4.0  Chloride 101 - 111 mmol/L - 107 104  CO2 22 - 32 mmol/L - 25 25  Calcium 8.9 - 10.3 mg/dL - 8.5(L) 10.0   Intake/Output      01/25 0701 - 01/26 0700 01/26 0701 - 01/27 0700   P.O. 220    I.V. (mL/kg)     Total Intake(mL/kg) 220 (2.5)    Urine (mL/kg/hr) 1600  (0.7) 300 (3.3)   Total Output 1600 300   Net -1380 -300         Physical Exam: General: NAD.  Upright in bed.  Conversant. MSK RLE: Neurovascularly intact Sensation intact distally Dorsiflexion/Plantar flexion intact Incision: dressing C/D/I  Prudencio Burly III, PA-C 07/05/2016, 8:02 AM

## 2016-07-06 DIAGNOSIS — K219 Gastro-esophageal reflux disease without esophagitis: Secondary | ICD-10-CM | POA: Diagnosis not present

## 2016-07-06 DIAGNOSIS — Z87442 Personal history of urinary calculi: Secondary | ICD-10-CM | POA: Diagnosis not present

## 2016-07-06 DIAGNOSIS — Z7982 Long term (current) use of aspirin: Secondary | ICD-10-CM | POA: Diagnosis not present

## 2016-07-06 DIAGNOSIS — I10 Essential (primary) hypertension: Secondary | ICD-10-CM | POA: Diagnosis not present

## 2016-07-06 DIAGNOSIS — W19XXXD Unspecified fall, subsequent encounter: Secondary | ICD-10-CM | POA: Diagnosis not present

## 2016-07-06 DIAGNOSIS — M1991 Primary osteoarthritis, unspecified site: Secondary | ICD-10-CM | POA: Diagnosis not present

## 2016-07-06 DIAGNOSIS — S72001D Fracture of unspecified part of neck of right femur, subsequent encounter for closed fracture with routine healing: Secondary | ICD-10-CM | POA: Diagnosis not present

## 2016-07-17 DIAGNOSIS — S72001D Fracture of unspecified part of neck of right femur, subsequent encounter for closed fracture with routine healing: Secondary | ICD-10-CM | POA: Diagnosis not present

## 2016-07-22 DIAGNOSIS — I10 Essential (primary) hypertension: Secondary | ICD-10-CM | POA: Diagnosis not present

## 2016-07-22 DIAGNOSIS — E7800 Pure hypercholesterolemia, unspecified: Secondary | ICD-10-CM | POA: Diagnosis not present

## 2016-07-22 DIAGNOSIS — S72001D Fracture of unspecified part of neck of right femur, subsequent encounter for closed fracture with routine healing: Secondary | ICD-10-CM | POA: Diagnosis not present

## 2016-07-24 DIAGNOSIS — S72001D Fracture of unspecified part of neck of right femur, subsequent encounter for closed fracture with routine healing: Secondary | ICD-10-CM | POA: Diagnosis not present

## 2016-07-25 NOTE — H&P (Signed)
[facility] [address] , [place] [address] [phone] A Division of [facility]  RE: Roger Saunders, Roger Saunders   [id number]      DOB: [birth year] 07-24-16 Reason for visit: Follow-up three weeks status post percutaneous pinning of a femoral neck fracture on 07-02-16.   HPI:  He was initially doing well but he started to develop more pain with activity. We saw some possible cut out on his first post operative X-rays at one to two weeks out. I have been watching him closely and we have considered a possible need for a conversion to a hemi versus total hip arthroplasty.  No systemic symptoms.   OBJECTIVE: He is a well appearing male in no apparent distress. The right lower extremity incision is benign. His gait is actually relatively stable with some Trendelenburg. He is neurovascularly intact.  IMAGES: X-rays today show a stable alignment from his previous views but he had lost significant reduction at his previous views.   ASSESSMENT/PLAN:  I am concerned he is going to see increasing pain as he is cutting out. I would like to set him up for a conversion to a hemi versus total hip arthroplasty based on intraoperative findings. We would need to remove his screws. If he shows significant clinical improvement we may consider holding on this, I think this is unlikely. If he shows a rapid downturn, as he may complete his cut out, then I would admit him to the hospital and do it more urgently. Again, this would be an intra-operative decision on conversion to total versus hemi hip depending on the status of his acetabular cartilage.    Ernesta Amble.  Percell Miller, M.D. Electronically verified by Ernesta Amble. Percell Miller, M.D. TDM::jgc  D  07-24-16 T  07-24-16 Cc:  Jani Gravel, MD fax [phone]

## 2016-07-26 ENCOUNTER — Encounter (HOSPITAL_COMMUNITY)
Admission: RE | Admit: 2016-07-26 | Discharge: 2016-07-26 | Disposition: A | Discharge status: Home / Self Care | Payer: Medicare HMO | Source: Ambulatory Visit | Attending: Orthopedic Surgery | Admitting: Orthopedic Surgery

## 2016-07-26 ENCOUNTER — Encounter (HOSPITAL_COMMUNITY): Payer: Self-pay

## 2016-07-26 ENCOUNTER — Other Ambulatory Visit (HOSPITAL_COMMUNITY): Payer: Self-pay | Admitting: *Deleted

## 2016-07-26 DIAGNOSIS — R69 Illness, unspecified: Secondary | ICD-10-CM | POA: Diagnosis not present

## 2016-07-26 DIAGNOSIS — S72001A Fracture of unspecified part of neck of right femur, initial encounter for closed fracture: Secondary | ICD-10-CM | POA: Insufficient documentation

## 2016-07-26 DIAGNOSIS — Z01812 Encounter for preprocedural laboratory examination: Secondary | ICD-10-CM | POA: Insufficient documentation

## 2016-07-26 DIAGNOSIS — I10 Essential (primary) hypertension: Secondary | ICD-10-CM | POA: Insufficient documentation

## 2016-07-26 DIAGNOSIS — F329 Major depressive disorder, single episode, unspecified: Secondary | ICD-10-CM | POA: Insufficient documentation

## 2016-07-26 DIAGNOSIS — K219 Gastro-esophageal reflux disease without esophagitis: Secondary | ICD-10-CM | POA: Insufficient documentation

## 2016-07-26 DIAGNOSIS — M1990 Unspecified osteoarthritis, unspecified site: Secondary | ICD-10-CM | POA: Insufficient documentation

## 2016-07-26 HISTORY — DX: Depression, unspecified: F32.A

## 2016-07-26 HISTORY — DX: Major depressive disorder, single episode, unspecified: F32.9

## 2016-07-26 HISTORY — DX: Gastro-esophageal reflux disease without esophagitis: K21.9

## 2016-07-26 HISTORY — DX: Unspecified chronic conjunctivitis, unspecified eye: H10.409

## 2016-07-26 HISTORY — DX: Personal history of urinary calculi: Z87.442

## 2016-07-26 LAB — BASIC METABOLIC PANEL
ANION GAP: 10 (ref 5–15)
BUN: 17 mg/dL (ref 6–20)
CHLORIDE: 102 mmol/L (ref 101–111)
CO2: 25 mmol/L (ref 22–32)
Calcium: 9.7 mg/dL (ref 8.9–10.3)
Creatinine, Ser: 0.92 mg/dL (ref 0.61–1.24)
GFR calc non Af Amer: >60 mL/min (ref >60)
GLUCOSE: 112 mg/dL — AB (ref 65–99)
POTASSIUM: 4.4 mmol/L (ref 3.5–5.1)
Sodium: 137 mmol/L (ref 135–145)

## 2016-07-26 LAB — CBC
HEMATOCRIT: 44.9 % (ref 39.0–52.0)
HEMOGLOBIN: 15.1 g/dL (ref 13.0–17.0)
MCH: 33 pg (ref 26.0–34.0)
MCHC: 33.6 g/dL (ref 30.0–36.0)
MCV: 98.2 fL (ref 78.0–100.0)
Platelets: 175 10*3/uL (ref 150–400)
RBC: 4.57 MIL/uL (ref 4.22–5.81)
RDW: 12.5 % (ref 11.5–15.5)
WBC: 7.5 10*3/uL (ref 4.0–10.5)

## 2016-07-26 LAB — SURGICAL PCR SCREEN
MRSA, PCR: NEGATIVE
Staphylococcus aureus: NEGATIVE

## 2016-07-26 NOTE — Pre-Procedure Instructions (Signed)
Roger Saunders  07/26/2016    Your procedure is scheduled on Tuesday, July 30, 2016 at 7:30 AM.   Report to [facility] Entrance "A" Admitting Office at 5:30 AM.   Call this number if you have problems the morning of surgery: [phone]   Questions prior to day of surgery, please call [phone] between 8 & 4 PM.   Remember:  Do not eat food or drink liquids after midnight Monday, 07/29/16.  Take these medicines the morning of surgery with A SIP OF WATER: Tramadol - if needed, Nexium - if needed, may use eye drops   Do not wear jewelry.  Do not wear lotions, powders or cologne.  Men may shave face and neck.  Do not bring valuables to the hospital.  [facility] is not responsible for any belongings or valuables.  Contacts, dentures or bridgework may not be worn into surgery.  Leave your suitcase in the car.  After surgery it may be brought to your room.  For patients admitted to the hospital, discharge time will be determined by your treatment team.  Special instructions:  Roger Saunders - Preparing for Surgery  Before surgery, you can play an important role.  Because skin is not sterile, your skin needs to be as free of germs as possible.  You can reduce the number of germs on you skin by washing with CHG (chlorahexidine gluconate) soap before surgery.  CHG is an antiseptic cleaner which kills germs and bonds with the skin to continue killing germs even after washing.  Please DO NOT use if you have an allergy to CHG or antibacterial soaps.  If your skin becomes reddened/irritated stop using the CHG and inform your nurse when you arrive at Short Stay.  Do not shave (including legs and underarms) for at least 48 hours prior to the first CHG shower.  You may shave your face.  Please follow these instructions carefully:   1.  Shower with CHG Soap the night before surgery and the                    morning of Surgery.  2.  If you choose to wash your hair, wash your hair  first as usual with your       normal shampoo.  3.  After you shampoo, rinse your hair and body thoroughly to remove the shampoo.  4.  Use CHG as you would any other liquid soap.  You can apply chg directly       to the skin and wash gently with scrungie or a clean washcloth.  5.  Apply the CHG Soap to your body ONLY FROM THE NECK DOWN.        Do not use on open wounds or open sores.  Avoid contact with your eyes, ears, mouth and genitals (private parts).  Wash genitals (private parts) with your normal soap.  6.  Wash thoroughly, paying special attention to the area where your surgery        will be performed.  7.  Thoroughly rinse your body with warm water from the neck down.  8.  DO NOT shower/wash with your normal soap after using and rinsing off       the CHG Soap.  9.  Pat yourself dry with a clean towel.            10.  Wear clean pajamas.            11.  Place clean sheets on your bed the night of your first shower and do not        sleep with pets.  Day of Surgery  Do not apply any lotions the morning of surgery.  Please wear clean clothes to the hospital.   Please read over the fact sheets that you were given.

## 2016-07-26 NOTE — Progress Notes (Signed)
Pt denies cardiac history, chest pain or sob. Pt states he is not diabetic. 

## 2016-07-26 NOTE — Progress Notes (Signed)
   07/26/16 1520  OBSTRUCTIVE SLEEP APNEA  Have you ever been diagnosed with sleep apnea through a sleep study? No  Do you snore loudly (loud enough to be heard through closed doors)?  1  Do you often feel tired, fatigued, or sleepy during the daytime (such as falling asleep during driving or talking to someone)? 0  Has anyone observed you stop breathing during your sleep? 0  Do you have, or are you being treated for high blood pressure? 1  BMI more than 35 kg/m2? 0  Age > 50 (1-yes) 1  Neck circumference greater than:Male 16 inches or larger, Male 17inches or larger? 1  Male Gender (Yes=1) 1  Obstructive Sleep Apnea Score 5  Score 5 or greater  Results sent to PCP

## 2016-07-29 MED ORDER — TRANEXAMIC ACID 1000 MG/10ML IV SOLN
1000.0000 mg | INTRAVENOUS | Status: AC
  Administered 2016-07-30: 1000 mg via INTRAVENOUS
  Filled 2016-07-29: qty 10

## 2016-07-29 MED ORDER — TRANEXAMIC ACID 1000 MG/10ML IV SOLN
2000.0000 mg | INTRAVENOUS | Status: AC
  Administered 2016-07-30: 2000 mg via TOPICAL
  Filled 2016-07-29: qty 20

## 2016-07-29 NOTE — Anesthesia Preprocedure Evaluation (Addendum)
Anesthesia Evaluation  Patient identified by MRN, date of birth, ID band Patient awake    Reviewed: Allergy & Precautions, H&P , NPO status , Patient's Chart, lab work & pertinent test results  Airway Mallampati: III  TM Distance: >3 FB Neck ROM: Full    Dental no notable dental hx. (+) Edentulous Upper, Dental Advisory Given   Pulmonary neg pulmonary ROS, former smoker,    Pulmonary exam normal breath sounds clear to auscultation       Cardiovascular Exercise Tolerance: Good hypertension, Pt. on medications  Rhythm:Regular Rate:Normal     Neuro/Psych Depression negative neurological ROS     GI/Hepatic Neg liver ROS, GERD  Medicated and Controlled,  Endo/Other  negative endocrine ROS  Renal/GU negative Renal ROS  negative genitourinary   Musculoskeletal  (+) Arthritis , Osteoarthritis,    Abdominal   Peds  Hematology negative hematology ROS (+)   Anesthesia Other Findings   Reproductive/Obstetrics negative OB ROS                           Anesthesia Physical Anesthesia Plan  ASA: II  Anesthesia Plan: Spinal   Post-op Pain Management:    Induction: Intravenous  Airway Management Planned: Simple Face Mask  Additional Equipment:   Intra-op Plan:   Post-operative Plan:   Informed Consent: I have reviewed the patients History and Physical, chart, labs and discussed the procedure including the risks, benefits and alternatives for the proposed anesthesia with the patient or authorized representative who has indicated his/her understanding and acceptance.   Dental advisory given  Plan Discussed with: CRNA  Anesthesia Plan Comments:         Anesthesia Quick Evaluation

## 2016-07-30 ENCOUNTER — Encounter (HOSPITAL_COMMUNITY): Payer: Self-pay | Admitting: Certified Registered Nurse Anesthetist

## 2016-07-30 ENCOUNTER — Encounter (HOSPITAL_COMMUNITY)
Admission: RE | Disposition: A | Discharge status: Home Health | Payer: Self-pay | Source: Ambulatory Visit | Attending: Orthopedic Surgery

## 2016-07-30 ENCOUNTER — Inpatient Hospital Stay (HOSPITAL_COMMUNITY): Payer: Medicare HMO | Admitting: Vascular Surgery

## 2016-07-30 ENCOUNTER — Inpatient Hospital Stay (HOSPITAL_COMMUNITY)
Admission: RE | Admit: 2016-07-30 | Discharge: 2016-07-31 | DRG: 470 | Disposition: A | Discharge status: Home Health | Payer: Medicare HMO | Source: Ambulatory Visit | Attending: Orthopedic Surgery | Admitting: Orthopedic Surgery

## 2016-07-30 ENCOUNTER — Inpatient Hospital Stay (HOSPITAL_COMMUNITY): Payer: Medicare HMO

## 2016-07-30 DIAGNOSIS — K219 Gastro-esophageal reflux disease without esophagitis: Secondary | ICD-10-CM | POA: Diagnosis present

## 2016-07-30 DIAGNOSIS — Z972 Presence of dental prosthetic device (complete) (partial): Secondary | ICD-10-CM

## 2016-07-30 DIAGNOSIS — Z96641 Presence of right artificial hip joint: Secondary | ICD-10-CM | POA: Diagnosis not present

## 2016-07-30 DIAGNOSIS — Z471 Aftercare following joint replacement surgery: Secondary | ICD-10-CM | POA: Diagnosis not present

## 2016-07-30 DIAGNOSIS — Z87442 Personal history of urinary calculi: Secondary | ICD-10-CM

## 2016-07-30 DIAGNOSIS — S72041A Displaced fracture of base of neck of right femur, initial encounter for closed fracture: Secondary | ICD-10-CM | POA: Diagnosis not present

## 2016-07-30 DIAGNOSIS — I10 Essential (primary) hypertension: Secondary | ICD-10-CM | POA: Diagnosis present

## 2016-07-30 DIAGNOSIS — Z87891 Personal history of nicotine dependence: Secondary | ICD-10-CM

## 2016-07-30 DIAGNOSIS — S72001A Fracture of unspecified part of neck of right femur, initial encounter for closed fracture: Secondary | ICD-10-CM | POA: Diagnosis present

## 2016-07-30 DIAGNOSIS — T84498A Other mechanical complication of other internal orthopedic devices, implants and grafts, initial encounter: Secondary | ICD-10-CM | POA: Diagnosis not present

## 2016-07-30 DIAGNOSIS — W19XXXD Unspecified fall, subsequent encounter: Secondary | ICD-10-CM

## 2016-07-30 DIAGNOSIS — M1990 Unspecified osteoarthritis, unspecified site: Secondary | ICD-10-CM

## 2016-07-30 HISTORY — PX: TOTAL HIP ARTHROPLASTY: SHX124

## 2016-07-30 SURGERY — ARTHROPLASTY, HIP, TOTAL,POSTERIOR APPROACH
Anesthesia: Spinal | Site: Hip | Laterality: Right

## 2016-07-30 MED ORDER — HYDROCODONE-ACETAMINOPHEN 5-325 MG PO TABS: 1.0000 | ORAL_TABLET | ORAL | 0 refills | Status: DC | PRN

## 2016-07-30 MED ORDER — SODIUM CHLORIDE 0.9 % IV SOLN
INTRAVENOUS | Status: DC | PRN
  Administered 2016-07-30: 60 mL

## 2016-07-30 MED ORDER — FENTANYL CITRATE (PF) 100 MCG/2ML IJ SOLN: 25.0000 ug | INTRAMUSCULAR | Status: DC | PRN

## 2016-07-30 MED ORDER — ONDANSETRON HCL 4 MG PO TABS: 4.0000 mg | ORAL_TABLET | Freq: Three times a day (TID) | ORAL | 0 refills | Status: DC | PRN

## 2016-07-30 MED ORDER — BUPIVACAINE LIPOSOME 1.3 % IJ SUSP
20.0000 mL | INTRAMUSCULAR | Status: AC
  Filled 2016-07-30: qty 20

## 2016-07-30 MED ORDER — PHENOL 1.4 % MT LIQD: 1.0000 | OROMUCOSAL | Status: DC | PRN

## 2016-07-30 MED ORDER — SENNA 8.6 MG PO TABS
1.0000 | ORAL_TABLET | Freq: Two times a day (BID) | ORAL | Status: DC
  Administered 2016-07-30 – 2016-07-31 (×3): 8.6 mg via ORAL
  Filled 2016-07-30 (×3): qty 1

## 2016-07-30 MED ORDER — FENTANYL CITRATE (PF) 100 MCG/2ML IJ SOLN
INTRAMUSCULAR | Status: DC | PRN
  Administered 2016-07-30: 25 ug via INTRAVENOUS

## 2016-07-30 MED ORDER — LACTATED RINGERS IV SOLN
INTRAVENOUS | Status: DC
  Administered 2016-07-30 – 2016-07-31 (×2): via INTRAVENOUS

## 2016-07-30 MED ORDER — BUPIVACAINE HCL (PF) 0.5 % IJ SOLN
INTRAMUSCULAR | Status: DC | PRN
  Administered 2016-07-30: 12 mL

## 2016-07-30 MED ORDER — FENTANYL CITRATE (PF) 100 MCG/2ML IJ SOLN
INTRAMUSCULAR | Status: AC
  Filled 2016-07-30: qty 2

## 2016-07-30 MED ORDER — BUPIVACAINE HCL (PF) 0.5 % IJ SOLN
INTRAMUSCULAR | Status: AC
  Filled 2016-07-30: qty 30

## 2016-07-30 MED ORDER — BACLOFEN 10 MG PO TABS
10.0000 mg | ORAL_TABLET | Freq: Three times a day (TID) | ORAL | Status: DC | PRN
Start: 2016-07-30 — End: 2016-07-31
  Administered 2016-07-30: 10 mg via ORAL
  Filled 2016-07-30: qty 1

## 2016-07-30 MED ORDER — ACETAMINOPHEN 500 MG PO TABS
ORAL_TABLET | ORAL | Status: AC
  Filled 2016-07-30: qty 2

## 2016-07-30 MED ORDER — MENTHOL 3 MG MT LOZG: 1.0000 | LOZENGE | OROMUCOSAL | Status: DC | PRN

## 2016-07-30 MED ORDER — CEFAZOLIN SODIUM-DEXTROSE 2-4 GM/100ML-% IV SOLN
INTRAVENOUS | Status: AC
  Filled 2016-07-30: qty 100

## 2016-07-30 MED ORDER — KETOROLAC TROMETHAMINE 15 MG/ML IJ SOLN
7.5000 mg | Freq: Four times a day (QID) | INTRAMUSCULAR | Status: AC
  Administered 2016-07-30 – 2016-07-31 (×4): 7.5 mg via INTRAVENOUS
  Filled 2016-07-30 (×4): qty 1

## 2016-07-30 MED ORDER — MIDAZOLAM HCL 2 MG/2ML IJ SOLN
INTRAMUSCULAR | Status: AC
Start: 2016-07-30 — End: 2016-07-30
  Filled 2016-07-30: qty 2

## 2016-07-30 MED ORDER — DOCUSATE SODIUM 100 MG PO CAPS: 100.0000 mg | ORAL_CAPSULE | Freq: Two times a day (BID) | ORAL | 0 refills | Status: DC

## 2016-07-30 MED ORDER — HYDROCODONE-ACETAMINOPHEN 5-325 MG PO TABS
1.0000 | ORAL_TABLET | ORAL | Status: DC | PRN
  Administered 2016-07-30 – 2016-07-31 (×5): 2 via ORAL
  Filled 2016-07-30 (×5): qty 2

## 2016-07-30 MED ORDER — BACLOFEN 10 MG PO TABS: 10.0000 mg | ORAL_TABLET | Freq: Three times a day (TID) | ORAL | 0 refills | Status: DC | PRN

## 2016-07-30 MED ORDER — ACETAMINOPHEN 500 MG PO TABS
1000.0000 mg | ORAL_TABLET | Freq: Once | ORAL | Status: AC
  Administered 2016-07-30: 1000 mg via ORAL

## 2016-07-30 MED ORDER — CEFAZOLIN SODIUM-DEXTROSE 2-4 GM/100ML-% IV SOLN
2.0000 g | INTRAVENOUS | Status: AC
  Administered 2016-07-30: 2 g via INTRAVENOUS

## 2016-07-30 MED ORDER — DOCUSATE SODIUM 100 MG PO CAPS
100.0000 mg | ORAL_CAPSULE | Freq: Two times a day (BID) | ORAL | Status: DC
  Administered 2016-07-30 – 2016-07-31 (×3): 100 mg via ORAL
  Filled 2016-07-30 (×3): qty 1

## 2016-07-30 MED ORDER — PANTOPRAZOLE SODIUM 40 MG PO PACK: 20.0000 mg | PACK | Freq: Every day | ORAL | Status: DC | PRN

## 2016-07-30 MED ORDER — METOCLOPRAMIDE HCL 5 MG PO TABS: 5.0000 mg | ORAL_TABLET | Freq: Three times a day (TID) | ORAL | Status: DC | PRN

## 2016-07-30 MED ORDER — LOSARTAN POTASSIUM 50 MG PO TABS
100.0000 mg | ORAL_TABLET | Freq: Every evening | ORAL | Status: DC
  Administered 2016-07-30: 100 mg via ORAL
  Filled 2016-07-30: qty 2

## 2016-07-30 MED ORDER — ASPIRIN EC 325 MG PO TBEC
325.0000 mg | DELAYED_RELEASE_TABLET | Freq: Every day | ORAL | Status: DC
  Administered 2016-07-31: 325 mg via ORAL
  Filled 2016-07-30: qty 1

## 2016-07-30 MED ORDER — PROPOFOL 10 MG/ML IV BOLUS
INTRAVENOUS | Status: AC
Start: 2016-07-30 — End: 2016-07-30
  Filled 2016-07-30: qty 20

## 2016-07-30 MED ORDER — CHLORHEXIDINE GLUCONATE 4 % EX LIQD: 60.0000 mL | Freq: Once | CUTANEOUS | Status: DC

## 2016-07-30 MED ORDER — MELATONIN 3 MG PO TABS
6.0000 mg | ORAL_TABLET | Freq: Every day | ORAL | Status: DC
  Administered 2016-07-30: 6 mg via ORAL
  Filled 2016-07-30: qty 2

## 2016-07-30 MED ORDER — ONDANSETRON HCL 4 MG PO TABS: 4.0000 mg | ORAL_TABLET | Freq: Four times a day (QID) | ORAL | Status: DC | PRN

## 2016-07-30 MED ORDER — PHENYLEPHRINE HCL 10 MG/ML IJ SOLN
INTRAMUSCULAR | Status: DC | PRN
  Administered 2016-07-30 (×3): 40 ug via INTRAVENOUS
  Administered 2016-07-30: 80 ug via INTRAVENOUS

## 2016-07-30 MED ORDER — ACETAMINOPHEN 325 MG PO TABS: 650.0000 mg | ORAL_TABLET | Freq: Four times a day (QID) | ORAL | Status: DC | PRN

## 2016-07-30 MED ORDER — DEXTROSE 5 % IV SOLN
INTRAVENOUS | Status: DC | PRN
  Administered 2016-07-30: 25 ug/min via INTRAVENOUS

## 2016-07-30 MED ORDER — METOCLOPRAMIDE HCL 5 MG/ML IJ SOLN: 5.0000 mg | Freq: Three times a day (TID) | INTRAMUSCULAR | Status: DC | PRN

## 2016-07-30 MED ORDER — ONDANSETRON HCL 4 MG/2ML IJ SOLN: 4.0000 mg | Freq: Four times a day (QID) | INTRAMUSCULAR | Status: DC | PRN

## 2016-07-30 MED ORDER — LACTATED RINGERS IV SOLN
INTRAVENOUS | Status: DC
  Administered 2016-07-30 (×2): via INTRAVENOUS

## 2016-07-30 MED ORDER — FLEET ENEMA 7-19 GM/118ML RE ENEM: 1.0000 | ENEMA | Freq: Once | RECTAL | Status: DC | PRN

## 2016-07-30 MED ORDER — PROPOFOL 500 MG/50ML IV EMUL
INTRAVENOUS | Status: DC | PRN
  Administered 2016-07-30: 50 ug/kg/min via INTRAVENOUS

## 2016-07-30 MED ORDER — PHENYLEPHRINE 40 MCG/ML (10ML) SYRINGE FOR IV PUSH (FOR BLOOD PRESSURE SUPPORT)
PREFILLED_SYRINGE | INTRAVENOUS | Status: AC
  Filled 2016-07-30: qty 10

## 2016-07-30 MED ORDER — SORBITOL 70 % SOLN: 30.0000 mL | Freq: Every day | Status: DC | PRN

## 2016-07-30 MED ORDER — PROPOFOL 1000 MG/100ML IV EMUL
INTRAVENOUS | Status: AC
Start: 2016-07-30 — End: 2016-07-30
  Filled 2016-07-30: qty 200

## 2016-07-30 MED ORDER — MORPHINE SULFATE (PF) 2 MG/ML IV SOLN
1.0000 mg | INTRAVENOUS | Status: DC | PRN
  Administered 2016-07-30 – 2016-07-31 (×4): 1 mg via INTRAVENOUS
  Filled 2016-07-30 (×4): qty 1

## 2016-07-30 MED ORDER — TRAMADOL HCL 50 MG PO TABS
50.0000 mg | ORAL_TABLET | Freq: Two times a day (BID) | ORAL | Status: DC | PRN
  Administered 2016-07-30: 50 mg via ORAL
  Filled 2016-07-30: qty 1

## 2016-07-30 MED ORDER — AMLODIPINE BESYLATE 5 MG PO TABS
5.0000 mg | ORAL_TABLET | Freq: Every evening | ORAL | Status: DC
  Administered 2016-07-30: 5 mg via ORAL
  Filled 2016-07-30: qty 1

## 2016-07-30 MED ORDER — POLYETHYLENE GLYCOL 3350 17 G PO PACK: 17.0000 g | PACK | Freq: Every day | ORAL | Status: DC | PRN

## 2016-07-30 MED ORDER — ASPIRIN EC 325 MG PO TBEC: 325.0000 mg | DELAYED_RELEASE_TABLET | Freq: Every day | ORAL | 0 refills | Status: DC

## 2016-07-30 MED ORDER — ACETAMINOPHEN 650 MG RE SUPP: 650.0000 mg | Freq: Four times a day (QID) | RECTAL | Status: DC | PRN

## 2016-07-30 MED ORDER — DEXAMETHASONE SODIUM PHOSPHATE 10 MG/ML IJ SOLN
10.0000 mg | Freq: Once | INTRAMUSCULAR | Status: AC
  Administered 2016-07-31: 10 mg via INTRAVENOUS
  Filled 2016-07-30: qty 1

## 2016-07-30 MED ORDER — SODIUM CHLORIDE 0.9 % IR SOLN: Status: DC | PRN

## 2016-07-30 MED ORDER — 0.9 % SODIUM CHLORIDE (POUR BTL) OPTIME
TOPICAL | Status: DC | PRN
  Administered 2016-07-30: 1000 mL

## 2016-07-30 MED ORDER — POLYVINYL ALCOHOL 1.4 % OP SOLN: 1.0000 [drp] | OPHTHALMIC | Status: DC | PRN

## 2016-07-30 MED ORDER — CEFAZOLIN SODIUM-DEXTROSE 2-4 GM/100ML-% IV SOLN
2.0000 g | Freq: Four times a day (QID) | INTRAVENOUS | Status: AC
  Administered 2016-07-30 (×2): 2 g via INTRAVENOUS
  Filled 2016-07-30 (×2): qty 100

## 2016-07-30 MED ORDER — DIPHENHYDRAMINE HCL 12.5 MG/5ML PO ELIX: 12.5000 mg | ORAL_SOLUTION | ORAL | Status: DC | PRN

## 2016-07-30 SURGICAL SUPPLY — 70 items
BLADE SAW SAG 73X25 THK (BLADE) ×1
BLADE SAW SGTL 73X25 THK (BLADE) ×1 IMPLANT
CAPT HIP TOTAL 1 ×1 IMPLANT
COVER SURGICAL LIGHT HANDLE (MISCELLANEOUS) ×2 IMPLANT
DRAPE IMP U-DRAPE 54X76 (DRAPES) ×2 IMPLANT
DRAPE INCISE IOBAN 66X45 STRL (DRAPES) ×2 IMPLANT
DRAPE ORTHO SPLIT 77X108 STRL (DRAPES) ×4
DRAPE SURG ORHT 6 SPLT 77X108 (DRAPES) ×2 IMPLANT
DRAPE U-SHAPE 47X51 STRL (DRAPES) ×1 IMPLANT
DRILL BIT 7/64X5 (BIT) ×2 IMPLANT
DRSG ADAPTIC 3X8 NADH LF (GAUZE/BANDAGES/DRESSINGS) IMPLANT
DRSG AQUACEL AG ADV 3.5X10 (GAUZE/BANDAGES/DRESSINGS) ×1 IMPLANT
DRSG PAD ABDOMINAL 8X10 ST (GAUZE/BANDAGES/DRESSINGS) ×2 IMPLANT
DURAPREP 26ML APPLICATOR (WOUND CARE) ×2 IMPLANT
ELECT BLADE 4.0 EZ CLEAN MEGAD (MISCELLANEOUS) ×2
ELECT BLADE 6.5 EXT (BLADE) IMPLANT
ELECT CAUTERY BLADE 6.4 (BLADE) ×2 IMPLANT
ELECT REM PT RETURN 9FT ADLT (ELECTROSURGICAL) ×2
ELECTRODE BLDE 4.0 EZ CLN MEGD (MISCELLANEOUS) ×1 IMPLANT
ELECTRODE REM PT RTRN 9FT ADLT (ELECTROSURGICAL) ×1 IMPLANT
FACESHIELD WRAPAROUND (MASK) ×4 IMPLANT
FACESHIELD WRAPAROUND OR TEAM (MASK) ×3 IMPLANT
GAUZE SPONGE 4X4 12PLY STRL (GAUZE/BANDAGES/DRESSINGS) ×1 IMPLANT
GAUZE XEROFORM 5X9 LF (GAUZE/BANDAGES/DRESSINGS) ×2 IMPLANT
GLOVE BIO SURGEON STRL SZ7.5 (GLOVE) ×4 IMPLANT
GLOVE BIOGEL PI IND STRL 8 (GLOVE) ×2 IMPLANT
GLOVE BIOGEL PI INDICATOR 8 (GLOVE) ×2
GOWN STRL REUS W/ TWL LRG LVL3 (GOWN DISPOSABLE) ×2 IMPLANT
GOWN STRL REUS W/ TWL XL LVL3 (GOWN DISPOSABLE) ×1 IMPLANT
GOWN STRL REUS W/TWL LRG LVL3 (GOWN DISPOSABLE) ×4
GOWN STRL REUS W/TWL XL LVL3 (GOWN DISPOSABLE) ×2
KIT BASIN OR (CUSTOM PROCEDURE TRAY) ×2 IMPLANT
KIT ROOM TURNOVER OR (KITS) ×2 IMPLANT
MANIFOLD NEPTUNE II (INSTRUMENTS) ×2 IMPLANT
NDL HYPO 25GX1X1/2 BEV (NEEDLE) ×1 IMPLANT
NEEDLE 22X1 1/2 (OR ONLY) (NEEDLE) ×2 IMPLANT
NEEDLE HYPO 25GX1X1/2 BEV (NEEDLE) ×2 IMPLANT
NS IRRIG 1000ML POUR BTL (IV SOLUTION) ×2 IMPLANT
PACK TOTAL JOINT (CUSTOM PROCEDURE TRAY) ×2 IMPLANT
PACK UNIVERSAL I (CUSTOM PROCEDURE TRAY) ×2 IMPLANT
PAD ARMBOARD 7.5X6 YLW CONV (MISCELLANEOUS) ×4 IMPLANT
PILLOW ABDUCTION HIP (SOFTGOODS) ×1 IMPLANT
RETRIEVER SUT HEWSON (MISCELLANEOUS) ×2 IMPLANT
STAPLER VISISTAT 35W (STAPLE) ×2 IMPLANT
STRIP CLOSURE SKIN 1/2X4 (GAUZE/BANDAGES/DRESSINGS) ×1 IMPLANT
SUCTION FRAZIER HANDLE 10FR (MISCELLANEOUS) ×1
SUCTION TUBE FRAZIER 10FR DISP (MISCELLANEOUS) ×1 IMPLANT
SUT FIBERWIRE #2 38 REV NDL BL (SUTURE) ×6
SUT MNCRL AB 4-0 PS2 18 (SUTURE) ×1 IMPLANT
SUT MON AB 2-0 CT1 36 (SUTURE) ×2 IMPLANT
SUT MON AB 4-0 RB1 27 (SUTURE) ×2 IMPLANT
SUT VIC AB 0 CT1 27 (SUTURE) ×4
SUT VIC AB 0 CT1 27XBRD ANBCTR (SUTURE) ×1 IMPLANT
SUT VIC AB 1 CTX 36 (SUTURE) ×8
SUT VIC AB 1 CTX36XBRD ANBCTR (SUTURE) ×4 IMPLANT
SUT VIC AB 2-0 CT1 27 (SUTURE)
SUT VIC AB 2-0 CT1 TAPERPNT 27 (SUTURE) IMPLANT
SUT VIC AB 3-0 FS2 27 (SUTURE) ×2 IMPLANT
SUT VIC AB 3-0 SH 27 (SUTURE)
SUT VIC AB 3-0 SH 27X BRD (SUTURE) IMPLANT
SUT VLOC 180 0 24IN GS25 (SUTURE) ×2 IMPLANT
SUTURE FIBERWR#2 38 REV NDL BL (SUTURE) ×3 IMPLANT
SYR 30ML SLIP (SYRINGE) ×2 IMPLANT
SYR 50ML LL SCALE MARK (SYRINGE) ×2 IMPLANT
SYR CONTROL 10ML LL (SYRINGE) ×2 IMPLANT
TOWEL OR 17X24 6PK STRL BLUE (TOWEL DISPOSABLE) ×2 IMPLANT
TOWEL OR 17X26 10 PK STRL BLUE (TOWEL DISPOSABLE) ×2 IMPLANT
TOWEL OR NON WOVEN STRL DISP B (DISPOSABLE) ×2 IMPLANT
TRAY FOLEY CATH 16FRSI W/METER (SET/KITS/TRAYS/PACK) ×2 IMPLANT
WATER STERILE IRR 1000ML POUR (IV SOLUTION) ×5 IMPLANT

## 2016-07-30 NOTE — Interval H&P Note (Signed)
History and Physical Interval Note:  07/30/2016 7:25 AM  Roger Saunders  has presented today for surgery, with the diagnosis of RIGHT HIP FX  The various methods of treatment have been discussed with the patient and family. After consideration of risks, benefits and other options for treatment, the patient has consented to  Procedure(s): RIGHT TOTAL HIP ARTHROPLASTY (Right) as a surgical intervention .  The patient's history has been reviewed, patient examined, no change in status, stable for surgery.  I have reviewed the patient's chart and labs.  Questions were answered to the patient's satisfaction.     Alisyn Lequire D

## 2016-07-30 NOTE — Transfer of Care (Signed)
Immediate Anesthesia Transfer of Care Note  Patient: Roger Saunders  Procedure(s) Performed: Procedure(s): RIGHT HEMI HIP ARTHROPLASTY (Right)  Patient Location: PACU  Anesthesia Type:MAC  Level of Consciousness: awake, alert , oriented, patient cooperative and responds to stimulation  Airway & Oxygen Therapy: Patient Spontanous Breathing  Post-op Assessment: Report given to RN and Post -op Vital signs reviewed and stable  Post vital signs: Reviewed and stable  Last Vitals:  Vitals:   07/30/16 0637  BP: 134/63  Pulse: 63  Resp: 18  Temp: 36.7 C    Last Pain:  Vitals:   07/30/16 0653  TempSrc:   PainSc: 3       Patients Stated Pain Goal: 6 (56/86/16 [id number])  Complications: No apparent anesthesia complications

## 2016-07-30 NOTE — Anesthesia Procedure Notes (Signed)
Procedure Name: MAC Date/Time: 07/30/2016 7:34 AM Performed by: Tressia Miners LEFFEW Pre-anesthesia Checklist: Patient identified, Emergency Drugs available, Suction available, Timeout performed and Patient being monitored Patient Re-evaluated:Patient Re-evaluated prior to inductionOxygen Delivery Method: Simple face mask Placement Confirmation: positive ETCO2

## 2016-07-30 NOTE — Discharge Instructions (Signed)
Posterior Hip Precautions: Do not cross legs Do not turn toes in (pigeon toed) Do not bend at hip / waist past 90 degrees   INSTRUCTIONS AFTER JOINT REPLACEMENT   o Remove items at home which could result in a fall. This includes throw rugs or furniture in walking pathways o ICE to the affected joint every three hours while awake for 30 minutes at a time, for at least the first 3-5 days, and then as needed for pain and swelling.  Continue to use ice for pain and swelling. You may notice swelling that will progress down to the foot and ankle.  This is normal after surgery.  Elevate your leg when you are not up walking on it.   o Continue to use the breathing machine you got in the hospital (incentive spirometer) which will help keep your temperature down.  It is common for your temperature to cycle up and down following surgery, especially at night when you are not up moving around and exerting yourself.  The breathing machine keeps your lungs expanded and your temperature down.   DIET:  As you were doing prior to hospitalization, we recommend a well-balanced diet.  DRESSING / WOUND CARE / SHOWERING  Keep the surgical dressing until follow up.  IF THE DRESSING FALLS OFF or the wound gets wet inside, change the dressing with sterile gauze.  Please use good hand washing techniques before changing the dressing.  Do not use any lotions or creams on the incision until instructed by your surgeon.    ACTIVITY  o Increase activity slowly as tolerated, but follow the weight bearing instructions below.   o No driving for 6 weeks or until further direction given by your physician.  You cannot drive while taking narcotics.  o No lifting or carrying greater than 10 lbs. until further directed by your surgeon. o Avoid periods of inactivity such as sitting longer than an hour when not asleep. This helps prevent blood clots.  o You may return to work once you are authorized by your doctor.     WEIGHT  BEARING   Weight bearing as tolerated with assist device (walker, cane, etc) as directed, use it as long as suggested by your surgeon or therapist, typically at least 4-6 weeks.   EXERCISES  Results after joint replacement surgery are often greatly improved when you follow the exercise, range of motion and muscle strengthening exercises prescribed by your doctor. Safety measures are also important to protect the joint from further injury. Any time any of these exercises cause you to have increased pain or swelling, decrease what you are doing until you are comfortable again and then slowly increase them. If you have problems or questions, call your caregiver or physical therapist for advice.   Rehabilitation is important following a joint replacement. After just a few days of immobilization, the muscles of the leg can become weakened and shrink (atrophy).  These exercises are designed to build up the tone and strength of the thigh and leg muscles and to improve motion. Often times heat used for twenty to thirty minutes before working out will loosen up your tissues and help with improving the range of motion but do not use heat for the first two weeks following surgery (sometimes heat can increase post-operative swelling).   These exercises can be done on a training (exercise) mat, on the floor, on a table or on a bed. Use whatever works the best and is most comfortable for you.  Use music or television while you are exercising so that the exercises are a pleasant break in your day. This will make your life better with the exercises acting as a break in your routine that you can look forward to.   Perform all exercises about fifteen times, three times per day or as directed.  You should exercise both the operative leg and the other leg as well.  Exercises include:    Quad Sets - Tighten up the muscle on the front of the thigh (Quad) and hold for 5-10 seconds.    Straight Leg Raises - With your  knee straight (if you were given a brace, keep it on), lift the leg to 60 degrees, hold for 3 seconds, and slowly lower the leg.  Perform this exercise against resistance later as your leg gets stronger.   Leg Slides: Lying on your back, slowly slide your foot toward your buttocks, bending your knee up off the floor (only go as far as is comfortable). Then slowly slide your foot back down until your leg is flat on the floor again.   Angel Wings: Lying on your back spread your legs to the side as far apart as you can without causing discomfort.   Hamstring Strength:  Lying on your back, push your heel against the floor with your leg straight by tightening up the muscles of your buttocks.  Repeat, but this time bend your knee to a comfortable angle, and push your heel against the floor.  You may put a pillow under the heel to make it more comfortable if necessary.   A rehabilitation program following joint replacement surgery can speed recovery and prevent re-injury in the future due to weakened muscles. Contact your doctor or a physical therapist for more information on knee rehabilitation.    CONSTIPATION  Constipation is defined medically as fewer than three stools per week and severe constipation as less than one stool per week.  Even if you have a regular bowel pattern at home, your normal regimen is likely to be disrupted due to multiple reasons following surgery.  Combination of anesthesia, postoperative narcotics, change in appetite and fluid intake all can affect your bowels.   YOU MUST use at least one of the following options; they are listed in order of increasing strength to get the job done.  They are all available over the counter, and you may need to use some, POSSIBLY even all of these options:    Drink plenty of fluids (prune juice may be helpful) and high fiber foods Colace 100 mg by mouth twice a day  Senokot for constipation as directed and as needed Dulcolax (bisacodyl), take  with full glass of water  Miralax (polyethylene glycol) once or twice a day as needed.  If you have tried all these things and are unable to have a bowel movement in the first 3-4 days after surgery call either your surgeon or your primary doctor.    If you experience loose stools or diarrhea, hold the medications until you stool forms back up.  If your symptoms do not get better within 1 week or if they get worse, check with your doctor.  If you experience "the worst abdominal pain ever" or develop nausea or vomiting, please contact the office immediately for further recommendations for treatment.   ITCHING:  If you experience itching with your medications, try taking only a single pain pill, or even half a pain pill at a time.  You can also  use Benadryl over the counter for itching or also to help with sleep.   TED HOSE STOCKINGS:  Use stockings on both legs until for at least 2 weeks or as directed by physician office. They may be removed at night for sleeping.  MEDICATIONS:  See your medication summary on the After Visit Summary that nursing will review with you.  You may have some home medications which will be placed on hold until you complete the course of blood thinner medication.  It is important for you to complete the blood thinner medication as prescribed.  PRECAUTIONS:  If you experience chest pain or shortness of breath - call 911 immediately for transfer to the hospital emergency department.   If you develop a fever greater that 101 F, purulent drainage from wound, increased redness or drainage from wound, foul odor from the wound/dressing, or calf pain - CONTACT YOUR SURGEON.                                                   FOLLOW-UP APPOINTMENTS:  If you do not already have a post-op appointment, please call the office for an appointment to be seen by your surgeon.  Guidelines for how soon to be seen are listed in your After Visit Summary, but are typically between 1-4 weeks  after surgery.  OTHER INSTRUCTIONS:   Knee Replacement:  Do not place pillow under knee, focus on keeping the knee straight while resting. CPM instructions: 0-90 degrees, 2 hours in the morning, 2 hours in the afternoon, and 2 hours in the evening. Place foam block, curve side up under heel at all times except when in CPM or when walking.  DO NOT modify, tear, cut, or change the foam block in any way.  MAKE SURE YOU:   Understand these instructions.   Get help right away if you are not doing well or get worse.    Thank you for letting us be a part of your medical care team.  It is a privilege we respect greatly.  We hope these instructions will help you stay on track for a fast and full recovery!

## 2016-07-30 NOTE — Progress Notes (Signed)
Pt. Complaining of heels burning. Requested to remove TED hose. TED HOSE removed bilaterally- pt. States that it feels better without them. Ted hose refused to be put back on and SCD's re-applied. Pt. States that that is more comfortable.

## 2016-07-30 NOTE — Anesthesia Procedure Notes (Signed)
Spinal  Patient location during procedure: OR Start time: 07/30/2016 7:34 AM End time: 07/30/2016 7:39 AM Staffing Anesthesiologist: Roderic Palau Performed: anesthesiologist  Preanesthetic Checklist Completed: patient identified, surgical consent, pre-op evaluation, timeout performed, IV checked, risks and benefits discussed and monitors and equipment checked Spinal Block Patient position: sitting Prep: DuraPrep Patient monitoring: cardiac monitor, continuous pulse ox and blood pressure Approach: midline Location: L3-4 Injection technique: single-shot Needle Needle type: Pencan  Needle gauge: 24 G Needle length: 9 cm Assessment Sensory level: T8 Additional Notes Functioning IV was confirmed and monitors were applied. Sterile prep and drape, including hand hygiene and sterile gloves were used. The patient was positioned and the spine was prepped. The skin was anesthetized with lidocaine.  Free flow of clear CSF was obtained prior to injecting local anesthetic into the CSF.  The spinal needle aspirated freely following injection.  The needle was carefully withdrawn.  The patient tolerated the procedure well.

## 2016-07-30 NOTE — Anesthesia Postprocedure Evaluation (Signed)
Anesthesia Post Note  Patient: Roger Saunders  Procedure(s) Performed: Procedure(s) (LRB): RIGHT HEMI HIP ARTHROPLASTY (Right)  Patient location during evaluation: PACU Anesthesia Type: Spinal Level of consciousness: awake and alert Pain management: pain level controlled Vital Signs Assessment: post-procedure vital signs reviewed and stable Respiratory status: spontaneous breathing and respiratory function stable Cardiovascular status: blood pressure returned to baseline and stable Postop Assessment: spinal receding Anesthetic complications: no       Last Vitals:  Vitals:   07/30/16 1100 07/30/16 1103  BP:  118/63  Pulse: (!) 55 64  Resp: (!) 9 14  Temp:      Last Pain:  Vitals:   07/30/16 0949  TempSrc:   PainSc: 0-No pain                 Israa Caban,W. EDMOND

## 2016-07-30 NOTE — Op Note (Signed)
07/30/2016  8:54 AM  PATIENT:  Roger Saunders   MRN: [id number]  PRE-OPERATIVE DIAGNOSIS:  RIGHT HIP FX  POST-OPERATIVE DIAGNOSIS:  RIGHT HIP FX  PROCEDURE:  Procedure(s): RIGHT TOTAL HIP ARTHROPLASTY  PREOPERATIVE INDICATIONS:  Roger Saunders is an 83 y.o. male who was admitted 07/30/2016 with a diagnosis of <principal problem not specified> and elected for surgical management.  The risks benefits and alternatives were discussed with the patient including but not limited to the risks of nonoperative treatment, versus surgical intervention including infection, bleeding, nerve injury, periprosthetic fracture, the need for revision surgery, dislocation, leg length discrepancy, blood clots, cardiopulmonary complications, morbidity, mortality, among others, and they were willing to proceed.  Predicted outcome is good, although there will be at least a six to nine month expected recovery.   OPERATIVE REPORT     SURGEON:  Edmonia Lynch, MD    ASSISTANT:  Roxan Hockey, PA-C, he was present and scrubbed throughout the case, critical for completion in a timely fashion, and for retraction, instrumentation, and closure.     ANESTHESIA:  General    COMPLICATIONS:  None.      COMPONENTS:  [employer] Acolade: Femoral stem: 5, Femoral Head:53, Neck:-4   PROCEDURE IN DETAIL: The patient was met in the holding area and identified.  The appropriate hip  was marked at the operative site. The patient was then transported to the OR and  placed under general anesthesia.  At that point, the patient was  placed in the lateral decubitus position with the operative side up and  secured to the operating room table and all bony prominences padded.     The operative lower extremity was prepped from the iliac crest to the toes.  Sterile draping was performed.  Time out was performed prior to incision.      A routine posterolateral approach was utilized via sharp dissection  carried down to the subcutaneous  tissue.  Gross bleeders were Bovie  coagulated.  The iliotibial band was identified and incised  along the length of the skin incision.  Self-retaining retractors were  inserted. I examined the bursa there was significant hematoma and edema I performed a bursectomy here.  With the hip internally rotated, the short external rotators  were identified. The piriformis was tagged with FiberWire, and the hip capsule released in a T-type fashion.  I removed the lag screws from his fracture fixation and they came out whole.   The femoral neck was exposed, and I resected the femoral neck using the appropriate jig. This was performed at approximately a thumb's breadth above the lesser trochanter.    I then exposed the deep acetabulum, cleared out any tissue including the ligamentum teres, and included the hip capsule in the FiberWire used above and below the T.    I then prepared the proximal femur using the cookie-cutter, the lateralizing reamer, and then sequentially broached.  A trial utilized, and I reduced the hip and it was found to have excellent stability with functional range of motion. The trial components were then removed.   The canal and acetabulum were thoroughly irrigated  I inserted the pressfit stem and placed the head and neck collar. The hip was reduced with appropriate force and was stable through a range of motion.   I then used a 2 mm drill bits to pass the FiberWire suture from the capsule and puriform is through the greater trochanter, and secured this. Excellent posterior capsular repair was achieved. I also closed the  T in the capsule.  I then irrigated the hip copiously again with pulse lavage, and repaired the fascia with Vicryl, followed by Vicryl for the subcutaneous tissue, Monocryl for the skin, Steri-Strips and sterile gauze. The wounds were injected. The patient was then awakened and returned to PACU in stable and satisfactory condition. There were no  complications.  POST-OP PLAN: Weight bearing as tolerated. DVT px will consist of SCD's and chemical px  Edmonia Lynch, MD Orthopedic Surgeon [phone]   07/30/2016 8:54 AM

## 2016-07-31 ENCOUNTER — Encounter (HOSPITAL_COMMUNITY): Payer: Self-pay | Admitting: Orthopedic Surgery

## 2016-07-31 LAB — CBC
HEMATOCRIT: 39.2 % (ref 39.0–52.0)
HEMOGLOBIN: 13 g/dL (ref 13.0–17.0)
MCH: 32.3 pg (ref 26.0–34.0)
MCHC: 33.2 g/dL (ref 30.0–36.0)
MCV: 97.5 fL (ref 78.0–100.0)
Platelets: 139 10*3/uL — ABNORMAL LOW (ref 150–400)
RBC: 4.02 MIL/uL — ABNORMAL LOW (ref 4.22–5.81)
RDW: 12.3 % (ref 11.5–15.5)
WBC: 7.6 10*3/uL (ref 4.0–10.5)

## 2016-07-31 LAB — BASIC METABOLIC PANEL
Anion gap: 6 (ref 5–15)
BUN: 10 mg/dL (ref 6–20)
CALCIUM: 8.8 mg/dL — AB (ref 8.9–10.3)
CHLORIDE: 101 mmol/L (ref 101–111)
CO2: 27 mmol/L (ref 22–32)
CREATININE: 0.78 mg/dL (ref 0.61–1.24)
GFR calc non Af Amer: >60 mL/min (ref >60)
Glucose, Bld: 114 mg/dL — ABNORMAL HIGH (ref 65–99)
Potassium: 4 mmol/L (ref 3.5–5.1)
SODIUM: 134 mmol/L — AB (ref 135–145)

## 2016-07-31 NOTE — Evaluation (Signed)
Physical Therapy Evaluation Patient Details Name: Roger Saunders MRN: [id number] DOB: December 05, 1931 Today's Date: 07/31/2016   History of Present Illness  83 y/o male three weeks status post percutaneous pinning of a femoral neck fracture on 07-02-16. At MD office, it was not healing properly and is now s/p R THA (07/30/16) with posterior hip precautions.  Clinical Impression  Pt admitted with above diagnosis. Pt currently with functional limitations due to the deficits listed below (see PT Problem List).  Pt will benefit from skilled PT to increase their independence and safety with mobility to allow discharge to the venue listed below.  Pt educated on hip precautions with mobility.  He will have 24 hour A for 1-2 weeks and recommend HHPT.     Follow Up Recommendations Home health PT    Equipment Recommendations  None recommended by PT    Recommendations for Other Services       Precautions / Restrictions Precautions Precautions: Fall;Posterior Hip Precaution Booklet Issued: Yes (comment) Restrictions RLE Weight Bearing: Weight bearing as tolerated      Mobility  Bed Mobility Overal bed mobility: Needs Assistance Bed Mobility: Supine to Sit     Supine to sit: Min assist     General bed mobility comments: MIN A to get trunk upright.  Transfers Overall transfer level: Needs assistance Equipment used: Rolling walker (2 wheeled) Transfers: Sit to/from Stand Sit to Stand: Min guard         General transfer comment: cues for proper hand placement and to maintain precautions  Ambulation/Gait Ambulation/Gait assistance: Min guard Ambulation Distance (Feet): 75 Feet Assistive device: Rolling walker (2 wheeled) Gait Pattern/deviations: Decreased step length - right;Decreased step length - left Gait velocity: decreased   General Gait Details: Pt with slow deliberate gait and able to remember how to turn properly to follow hip precautions. He did need cues for posture as he  kept watching his feet.  Stairs            Wheelchair Mobility    Modified Rankin (Stroke Patients Only)       Balance Overall balance assessment: Needs assistance Sitting-balance support: Feet supported         Standing balance-Leahy Scale: Fair Standing balance comment: Pt able to stand without UE support                             Pertinent Vitals/Pain      Home Living Family/patient expects to be discharged to:: Private residence Living Arrangements: Alone Available Help at Discharge: Friend(s);Available 24 hours/day (24 hours/day for 1-2 weeks) Type of Home: House Home Access: Stairs to enter   [employer] of Steps: 1 then 5' then 1 more Home Layout: Two level;Able to live on main level with bedroom/bathroom Home Equipment: Gilford Rile - 2 wheels;Bedside commode;Adaptive equipment Additional Comments: Pt lives alone but planning to d/c to friends home who can provide assist as needed.     Prior Function Level of Independence: Independent         Comments: Pt active and working 2 jobs.     Hand Dominance   Dominant Hand: Right    Extremity/Trunk Assessment   Upper Extremity Assessment Upper Extremity Assessment: Defer to OT evaluation    Lower Extremity Assessment Lower Extremity Assessment: RLE deficits/detail RLE Deficits / Details: Grossly 3+/5 with hip flexion initially 40 degrees with increased ROM with therex    Cervical / Trunk Assessment Cervical / Trunk Assessment:  Normal  Communication   Communication: No difficulties  Cognition Arousal/Alertness: Awake/alert Behavior During Therapy: WFL for tasks assessed/performed Overall Cognitive Status: Within Functional Limits for tasks assessed                      General Comments      Exercises Total Joint Exercises Ankle Circles/Pumps: AROM;Both;10 reps Quad Sets: Right;10 reps;Supine Gluteal Sets: Strengthening;15 reps Heel Slides: AROM;Right;10  reps Hip ABduction/ADduction: AROM;Right;10 reps Goniometric ROM: 40 degrees hip flexion with first few heels slides, but increased with reps   Assessment/Plan    PT Assessment Patient needs continued PT services  PT Problem List Decreased strength;Decreased range of motion;Decreased activity tolerance;Decreased balance;Decreased mobility;Decreased knowledge of use of DME;Decreased knowledge of precautions       PT Treatment Interventions DME instruction;Gait training;Stair training;Functional mobility training;Therapeutic exercise;Therapeutic activities;Balance training    PT Goals (Current goals can be found in the Care Plan section)  Acute Rehab PT Goals Patient Stated Goal: go to friend's house today or tomorrow PT Goal Formulation: With patient Time For Goal Achievement: 08/07/16 Potential to Achieve Goals: Good    Frequency 7X/week   Barriers to discharge        Co-evaluation               End of Session Equipment Utilized During Treatment: Gait belt Activity Tolerance: Patient tolerated treatment well Patient left: in chair;with call bell/phone within reach Nurse Communication: Mobility status PT Visit Diagnosis: Difficulty in walking, not elsewhere classified (R26.2)         Time: [id number] PT Time Calculation (min) (ACUTE ONLY): 38 min   Charges:   PT Evaluation $PT Eval Moderate Complexity: 1 Procedure PT Treatments $Gait Training: 8-22 mins $Therapeutic Exercise: 8-22 mins   PT G Codes:         Krissie Merrick LUBECK 07/31/2016, 10:11 AM

## 2016-07-31 NOTE — Care Management Note (Signed)
Case Management Note  Patient Details  Name: Roger Saunders MRN: [id number] Date of Birth: [birth year]  Subjective/Objective:   83 yr old male s/p right hip hemiarthroplasty.                 Action/Plan: Case manager spoke with patient and daughter concerning home health and DME needs. Choice was offered for Home health Agency. Patient states he used [facility] for last surgery and wants to do so now. CM called referral to Stevie Kern, [facility] Liaison. Patient states he has rolling walker and 3in1. Will have family and friends assiting at discharge.    Expected Discharge Date:  07/31/16               Expected Discharge Plan:  [place]  In-House Referral:  NA  Discharge planning Services  CM Consult  Post Acute Care Choice:  Home Health Choice offered to:  Patient, Adult Children  DME Arranged:  N/A (Patient has RW and 3in1 from previous surgery) DME Agency:  NA  HH Arranged:  PT [place] Agency:  [place]  Status of Service:  Completed, signed off  If discussed at [place] of Stay Meetings, dates discussed:    Additional Comments:  Ninfa Meeker, RN 07/31/2016, 2:53 PM

## 2016-07-31 NOTE — Discharge Summary (Signed)
Discharge Summary  Patient ID: Roger Saunders MRN: [id number] DOB/AGE: [birth year] 83 y.o.  Admit date: 07/30/2016 Discharge date: 07/31/2016  Admission Diagnoses:  Hip fracture, right [facility])  Discharge Diagnoses:  Principal Problem:   Hip fracture, right ([place]) Active Problems:   Essential hypertension   GERD (gastroesophageal reflux disease)   Past Medical History:  Diagnosis Date  . Arthritis   . Conjunctivitis, chronic   . Depression    situational  . GERD (gastroesophageal reflux disease)    occasionally  . History of kidney stones   . Hypertension     Surgeries: Procedure(s): RIGHT HEMI HIP ARTHROPLASTY on 07/30/2016   Consultants (if any):   Discharged Condition: Improved  Hospital Course: Roger Saunders is an 83 y.o. male who was admitted 07/30/2016 with a diagnosis of Hip fracture, right ([place]) and went to the operating room on 07/30/2016 and underwent the above named procedures.    He was given perioperative antibiotics:  Anti-infectives    Start     Dose/Rate Route Frequency Ordered Stop   07/30/16 1330  ceFAZolin (ANCEF) IVPB 2g/100 mL premix     2 g 200 mL/hr over 30 Minutes Intravenous Every 6 hours 07/30/16 1152 07/30/16 2202   07/30/16 0700  ceFAZolin (ANCEF) IVPB 2g/100 mL premix     2 g 200 mL/hr over 30 Minutes Intravenous To ShortStay Surgical 07/30/16 0642 07/30/16 0758   07/30/16 0646  ceFAZolin (ANCEF) 2-4 GM/100ML-% IVPB    Comments:  Nyoka Cowden   : cabinet override      07/30/16 [id number] 07/30/16 0743    .  He was given sequential compression devices, early ambulation, and ASA 325 mg for DVT prophylaxis.  He benefited maximally from the hospital stay and there were no complications.    Recent vital signs:  Vitals:   07/31/16 0120 07/31/16 0507  BP: 120/73 126/66  Pulse: 75 71  Resp: 18 18  Temp: 99.7 F (37.6 C) 98.2 F (36.8 C)    Recent laboratory studies:  Lab Results  Component Value Date   HGB 13.0 07/31/2016   HGB 15.1  07/26/2016   HGB 13.4 07/02/2016   Lab Results  Component Value Date   WBC 7.6 07/31/2016   PLT 139 (L) 07/31/2016   Lab Results  Component Value Date   INR 0.99 07/01/2016   Lab Results  Component Value Date   NA 134 (L) 07/31/2016   K 4.0 07/31/2016   CL 101 07/31/2016   CO2 27 07/31/2016   BUN 10 07/31/2016   CREATININE 0.78 07/31/2016   GLUCOSE 114 (H) 07/31/2016    Discharge Medications:   Allergies as of 07/31/2016      Reactions   No Known Allergies       Medication List    STOP taking these medications   omeprazole 20 MG capsule Commonly known as:  PRILOSEC   traMADol 50 MG tablet Commonly known as:  ULTRAM     TAKE these medications   amLODipine 5 MG tablet Commonly known as:  NORVASC Take 5 mg by mouth every evening.   aspirin EC 325 MG tablet Take 1 tablet (325 mg total) by mouth Roger. For 30 days post op for DVT Prophylaxis   baclofen 10 MG tablet Commonly known as:  LIORESAL Take 1 tablet (10 mg total) by mouth 3 (three) times Roger as needed for muscle spasms.   docusate sodium 100 MG capsule Commonly known as:  COLACE Take 1 capsule (100 mg total) by  mouth 2 (two) times Roger. To prevent constipation while taking pain medication.   esomeprazole 20 MG packet Commonly known as:  NEXIUM Take 20 mg by mouth Roger as needed (heartburn/acid reflux).   HYDROcodone-acetaminophen 5-325 MG tablet Commonly known as:  NORCO Take 1-2 tablets by mouth every 4 (four) hours as needed for moderate pain.   losartan 100 MG tablet Commonly known as:  COZAAR Take 100 mg by mouth every evening.   ondansetron 4 MG tablet Commonly known as:  ZOFRAN Take 1 tablet (4 mg total) by mouth every 8 (eight) hours as needed for nausea or vomiting.   REFRESH OPTIVE OP Place 1 drop into both eyes every 4 (four) hours as needed (dry eyes).       Diagnostic Studies: Dg C-arm 1-60 Min  Result Date: 07/02/2016 CLINICAL DATA:  Internal fixation of right femoral  neck fracture. Initial encounter. EXAM: OPERATIVE RIGHT HIP (WITH PELVIS IF PERFORMED) 2 VIEWS TECHNIQUE: Fluoroscopic spot image(s) were submitted for interpretation post-operatively. COMPARISON:  Right hip radiographs performed 07/01/2016 FINDINGS: The patient is status post placement of 2 pins across the right femoral neck fracture, transfixing the fracture in grossly anatomic alignment. No new fractures are seen. The right femoral head remains seated at the acetabulum. IMPRESSION: Status post internal fixation of right femoral neck fracture in grossly anatomic alignment. Electronically Signed   By: Garald Balding M.D.   On: 07/02/2016 19:52   Dg Hip Port Unilat With Pelvis 1v Right  Result Date: 07/30/2016 CLINICAL DATA:  Status post right total hip joint prosthesis placement. EXAM: DG HIP (WITH OR WITHOUT PELVIS) 1V PORT RIGHT COMPARISON:  Intraoperative fluoro spot views from July 02, 2016 FINDINGS: The patient has undergone right total hip joint prosthesis placement. Radiographic positioning of the prosthetic components is good. The native bone exhibits no acute abnormality. There is small amount of soft tissue gas laterally. IMPRESSION: No immediate postprocedure complication following right total hip joint prosthesis placement. Electronically Signed   By: David  Martinique M.D.   On: 07/30/2016 12:28   Dg Hip Operative Unilat W Or W/o Pelvis Right  Result Date: 07/02/2016 CLINICAL DATA:  Internal fixation of right femoral neck fracture. Initial encounter. EXAM: OPERATIVE RIGHT HIP (WITH PELVIS IF PERFORMED) 2 VIEWS TECHNIQUE: Fluoroscopic spot image(s) were submitted for interpretation post-operatively. COMPARISON:  Right hip radiographs performed 07/01/2016 FINDINGS: The patient is status post placement of 2 pins across the right femoral neck fracture, transfixing the fracture in grossly anatomic alignment. No new fractures are seen. The right femoral head remains seated at the acetabulum.  IMPRESSION: Status post internal fixation of right femoral neck fracture in grossly anatomic alignment. Electronically Signed   By: Garald Balding M.D.   On: 07/02/2016 19:52   Dg Hip Unilat With Pelvis Min 4 Views Right  Result Date: 07/01/2016 CLINICAL DATA:  Recent fall with right leg pain, initial encounter EXAM: DG HIP (WITH OR WITHOUT PELVIS) 4+V RIGHT COMPARISON:  Films from earlier in the same day FINDINGS: There again seen irregularities along the inferior aspect of the right femoral head suggestive of an impacted fracture in the junction of the femoral head and neck. No other bony abnormality is seen. IMPRESSION: Changes suggestive of impacted fracture in the proximal right femur. Electronically Signed   By: Inez Catalina M.D.   On: 07/01/2016 20:09   Dg Femur, Min 2 Views Right  Result Date: 07/01/2016 CLINICAL DATA:  Slip and fall with right leg pain, initial encounter EXAM: RIGHT FEMUR 2  VIEWS COMPARISON:  03/05/2013 FINDINGS: Irregularity at the junction of the femoral neck and femoral head is noted highly suspicious for an impacted fracture. The remainder of femur is within normal limits. Degenerative changes about the knee joint are seen. No effusion is noted. Vascular calcifications are seen. IMPRESSION: Changes highly suspicious for impacted fracture at the junction of femoral head and femoral neck. Dedicated hip films may be helpful. Electronically Signed   By: Inez Catalina M.D.   On: 07/01/2016 16:54    Disposition: 06-Home-Health Care Svc  Discharge Instructions    Discharge patient    Complete by:  As directed    Discharge disposition:  01-Home or Self Care   Discharge patient date:  07/31/2016      Follow-up Information    MURPHY, Ernesta Amble, MD Follow up.   Specialty:  Orthopedic Surgery Contact information: [address] [place] [address] [phone]            Signed: Prudencio Burly III PA-C 07/31/2016, 2:35 PM

## 2016-07-31 NOTE — Evaluation (Signed)
Occupational Therapy Evaluation and Discharge Patient Details Name: Roger Saunders MRN: [id number] DOB: 1931-09-19 Today's Date: 07/31/2016    History of Present Illness 83 y/o male three weeks status post percutaneous pinning of a femoral neck fracture on 07-02-16. At MD office, it was not healing properly and is now s/p R THA (07/30/16) with posterior hip precautions.   Clinical Impression   Pt admitted with above and presents to OT with deficits impacting his ability to complete ADLs independently.  Pt requires mod assist for LB bathing and dressing without use of AD, however educated on use of reacher and sock aid for LB dressing with pt able to complete with supervision/cues to abstain from IR.  All mobility supervision with RW.  Pt and pt's daughter report that pt to stay with a friend for 1-2 weeks that can provide supervision assist as needed.  Pt with no further OT needs at this time.    Follow Up Recommendations  No OT follow up;Supervision - Intermittent    Equipment Recommendations  None recommended by OT    Recommendations for Other Services       Precautions / Restrictions Precautions Precautions: Fall;Posterior Hip Precaution Booklet Issued: Yes (comment) Precaution Comments: Pt able to recall 2/3 hip precautions without cueing, forgetting no IR Restrictions Weight Bearing Restrictions: Yes RLE Weight Bearing: Weight bearing as tolerated      Mobility Bed Mobility Overal bed mobility: Modified Independent Bed Mobility: Supine to Sit     Supine to sit: Supervision Sit to supine: Min guard   General bed mobility comments: pt able to complete bed mobility with use of bed rail and min cues for no internal rotation  Transfers Overall transfer level: Needs assistance Equipment used: Rolling walker (2 wheeled) Transfers: Sit to/from Stand Sit to Stand: Supervision         General transfer comment: cues for technique         ADL Overall ADL's : Needs  assistance/impaired         Upper Body Bathing: Supervision/ safety   Lower Body Bathing: Moderate assistance   Upper Body Dressing : Set up;Sitting   Lower Body Dressing: Moderate assistance;Sit to/from stand Lower Body Dressing Details (indicate cue type and reason): Mod assist without reacher, Supervision/setup with reacher and sock aid Toilet Transfer: Supervision/safety;Ambulation;RW           Functional mobility during ADLs: Supervision/safety;Rolling walker       Vision Baseline Vision/History: Wears glasses Wears Glasses: At all times Patient Visual Report: No change from baseline Vision Assessment?: No apparent visual deficits            Pertinent Vitals/Pain Pain Assessment: 0-10 Pain Score: 5  Pain Location: R hip Pain Descriptors / Indicators: Grimacing;Sore Pain Intervention(s): Limited activity within patient's tolerance;Monitored during session;Premedicated before session     Hand Dominance Right   Extremity/Trunk Assessment Upper Extremity Assessment Upper Extremity Assessment: Overall WFL for tasks assessed           Communication Communication Communication: No difficulties   Cognition Arousal/Alertness: Awake/alert Behavior During Therapy: WFL for tasks assessed/performed Overall Cognitive Status: Within Functional Limits for tasks assessed                                Home Living Family/patient expects to be discharged to:: Private residence Living Arrangements: Alone Available Help at Discharge: Friend(s);Available 24 hours/day (24 hrs/day for 1-2 weeks) Type of Home: House  Home Access: Stairs to enter Entrance Stairs-Number of Steps: 1 then 5' then 1 more   Home Layout: Two level;Able to live on main level with bedroom/bathroom     Bathroom Shower/Tub: Teacher, early years/pre: Standard     Home Equipment: Environmental consultant - 2 wheels;Bedside commode;Adaptive equipment;Toilet riser;Grab bars -  tub/shower Adaptive Equipment: Reacher Additional Comments: Pt lives alone but planning to d/c to friends home who can provide assist as needed.       Prior Functioning/Environment Level of Independence: Independent        Comments: Pt active and working 2 jobs.        OT Problem List: Decreased range of motion;Decreased knowledge of use of DME or AE;Decreased knowledge of precautions;Pain      OT Treatment/Interventions:      OT Goals(Current goals can be found in the care plan section) Acute Rehab OT Goals Patient Stated Goal: leave today OT Goal Formulation: All assessment and education complete, DC therapy                End of Session Equipment Utilized During Treatment: Rolling walker Nurse Communication: Mobility status (ready for d/c)  Activity Tolerance: Patient tolerated treatment well;No increased pain Patient left: in chair;with call bell/phone within reach;with family/visitor present  OT Visit Diagnosis: Unsteadiness on feet (R26.81);Muscle weakness (generalized) (M62.81);Pain Pain - Right/Left: Right Pain - part of body: Hip                ADL either performed or assessed with clinical judgement  Time: 1445-1512 OT Time Calculation (min): 27 min Charges:  OT General Charges $OT Visit: 1 Procedure OT Evaluation $OT Eval Low Complexity: 1 Procedure OT Treatments $Self Care/Home Management : 8-22 mins G-Codes:     Amillion Scobee August 23, 2016, 3:30 PM

## 2016-07-31 NOTE — Progress Notes (Addendum)
Physical Therapy Treatment Patient Details Name: Roger Saunders MRN: [id number] DOB: August 22, 1931 Today's Date: 07/31/2016    History of Present Illness 83 y/o male three weeks status post percutaneous pinning of a femoral neck fracture on 07-02-16. At MD office, it was not healing properly and is now s/p R THA (07/30/16) with posterior hip precautions.    PT Comments    Pt moving well overall.  He was able to recall 2/3 hip precautions without cueing.  He was able to perform 1 step with RW with proper sequencing.  Pt plans to d/c today which is a safe d/c plan from a PT standpoint.  His friends will be able to help him with mobility as needed. Recommend HHPT.  Follow Up Recommendations  Home health PT     Equipment Recommendations  None recommended by PT    Recommendations for Other Services       Precautions / Restrictions Precautions Precautions: Fall;Posterior Hip Precaution Booklet Issued: Yes (comment) Precaution Comments: Pt able to recall 2/3 hip precautions without cueing, forgetting no IR Restrictions Weight Bearing Restrictions: Yes RLE Weight Bearing: Weight bearing as tolerated    Mobility  Bed Mobility Overal bed mobility: Needs Assistance Bed Mobility: Sit to Supine     Supine to sit: Min assist Sit to supine: Min guard   General bed mobility comments: Pt able to get leg up on bed with increased time and some difficulty  Transfers Overall transfer level: Needs assistance Equipment used: Rolling walker (2 wheeled) Transfers: Sit to/from Stand Sit to Stand: Min guard         General transfer comment: cues for technique  Ambulation/Gait Ambulation/Gait assistance: Min guard;Supervision Ambulation Distance (Feet): 140 Feet Assistive device: Rolling walker (2 wheeled) Gait Pattern/deviations: Decreased step length - right;Decreased step length - left;Antalgic;Decreased stance time - right Gait velocity: decreased   General Gait Details: Pt with  antalgic gait pattern this afternoon, but able to increase distance and precautions within to mobility   Stairs Stairs: Yes   Stair Management: Forwards;With walker;Backwards Number of Stairs: 1 General stair comments: Pt with good recall of stair sequence  Wheelchair Mobility    Modified Rankin (Stroke Patients Only)       Balance Overall balance assessment: Needs assistance Sitting-balance support: Feet supported         Standing balance-Leahy Scale: Fair Standing balance comment: Pt able to stand without UE support                    Cognition Arousal/Alertness: Awake/alert Behavior During Therapy: WFL for tasks assessed/performed Overall Cognitive Status: Within Functional Limits for tasks assessed                      Exercises     General Comments        Pertinent Vitals/Pain Pain Assessment: 0-10 Pain Score: 5  Pain Location: R hip Pain Descriptors / Indicators: Grimacing;Sore Pain Intervention(s): Limited activity within patient's tolerance;Monitored during session;Repositioned;Patient requesting pain meds-RN notified    Home Living                      Prior Function            PT Goals (current goals can now be found in the care plan section) Acute Rehab PT Goals Patient Stated Goal: leave today PT Goal Formulation: With patient Time For Goal Achievement: 08/07/16 Potential to Achieve Goals: Good Progress towards PT goals: Progressing toward  goals    Frequency    7X/week      PT Plan Current plan remains appropriate    Co-evaluation             End of Session Equipment Utilized During Treatment: Gait belt Activity Tolerance: Patient tolerated treatment well Patient left: in bed;with call bell/phone within reach;with family/visitor present Nurse Communication: Mobility status PT Visit Diagnosis: Difficulty in walking, not elsewhere classified (R26.2)     Time: [id number] PT Time Calculation (min)  (ACUTE ONLY): 24 min  Charges:  $Gait Training: 23-37 mins                    G Codes:       Jorene Kaylor LUBECK 07/31/2016, 1:54 PM

## 2016-07-31 NOTE — Progress Notes (Signed)
Discharge instructions given. Pt verbalized understanding and all questions were answered.  

## 2016-07-31 NOTE — Progress Notes (Addendum)
   Assessment: 1 Day Post-Op  S/P Procedure(s) (LRB): RIGHT HEMI HIP ARTHROPLASTY (Right) by Dr. Ernesta Amble. Murphy on 07/30/16  Principal Problem:   Hip fracture, right ([place]) Active Problems:   Essential hypertension   GERD (gastroesophageal reflux disease)  AFVSN. Doing well.  Pain controlled.  Not yet OOB.  Plan: Advance diet Up with therapy D/C IV fluids Please provide Incentive Spirometer  Weight Bearing: Weight Bearing as Tolerated (WBAT) Right leg.  Posterior hip precautions. Dressings: Aquacel.  VTE prophylaxis: Aspirin, SCDs, ambulation Dispo: Home w/ HHPT in care of his friend / neighbor.  As soon as late today pending progress / PT eval.  More likely d/c tomorrow 08/01/16.  Please call if patient cleared by PT and desires d/c today.  Subjective: Patient reports pain as moderate. Pain controlled with PO meds.  Tolerating liquids.  Urinating.  +Flatus.  No CP, SOB.  He has not yet been OOB.  Objective:   VITALS:   Vitals:   07/30/16 1648 07/30/16 2057 07/31/16 0120 07/31/16 0507  BP: 120/66 123/62 120/73 126/66  Pulse:  66 75 71  Resp:  18 18 18   Temp:  (!) 100.8 F (38.2 C) 99.7 F (37.6 C) 98.2 F (36.8 C)  TempSrc:  Oral Oral Oral  SpO2:  95% 96% 96%   CBC Latest Ref Rng & Units 07/31/2016 07/26/2016 07/02/2016  WBC 4.0 - 10.5 K/uL 7.6 7.5 9.4  Hemoglobin 13.0 - 17.0 g/dL 13.0 15.1 13.4  Hematocrit 39.0 - 52.0 % 39.2 44.9 40.1  Platelets 150 - 400 K/uL 139(L) 175 131(L)   BMP Latest Ref Rng & Units 07/31/2016 07/26/2016 07/02/2016  Glucose 65 - 99 mg/dL 114(H) 112(H) -  BUN 6 - 20 mg/dL 10 17 -  Creatinine 0.61 - 1.24 mg/dL 0.78 0.92 0.87  Sodium 135 - 145 mmol/L 134(L) 137 -  Potassium 3.5 - 5.1 mmol/L 4.0 4.4 -  Chloride 101 - 111 mmol/L 101 102 -  CO2 22 - 32 mmol/L 27 25 -  Calcium 8.9 - 10.3 mg/dL 8.8(L) 9.7 -   Intake/Output      02/20 0701 - 02/21 0700 02/21 0701 - 02/22 0700   P.O. 720    I.V. 2350    Total Intake 3070     Urine 2050    Blood 200    Total Output 2250     Net +820            Physical Exam: General: NAD.  Upright in bed. Resp: No increased wob.  Clear A/P. Cardio: regular rate and rhythm ABD soft Neurologically intact MSK Neurovascularly intact Sensation intact distally Feet warm Dorsiflexion/Plantar flexion intact Incision: dressing C/D/I   Prudencio Burly III, PA-C 07/31/2016, 7:38 AM

## 2016-08-01 DIAGNOSIS — R69 Illness, unspecified: Secondary | ICD-10-CM | POA: Diagnosis not present

## 2016-08-01 DIAGNOSIS — Z96641 Presence of right artificial hip joint: Secondary | ICD-10-CM | POA: Diagnosis not present

## 2016-08-01 DIAGNOSIS — M1991 Primary osteoarthritis, unspecified site: Secondary | ICD-10-CM | POA: Diagnosis not present

## 2016-08-01 DIAGNOSIS — Z79891 Long term (current) use of opiate analgesic: Secondary | ICD-10-CM | POA: Diagnosis not present

## 2016-08-01 DIAGNOSIS — I10 Essential (primary) hypertension: Secondary | ICD-10-CM | POA: Diagnosis not present

## 2016-08-01 DIAGNOSIS — K219 Gastro-esophageal reflux disease without esophagitis: Secondary | ICD-10-CM | POA: Diagnosis not present

## 2016-08-01 DIAGNOSIS — Z9181 History of falling: Secondary | ICD-10-CM | POA: Diagnosis not present

## 2016-08-01 DIAGNOSIS — Z7982 Long term (current) use of aspirin: Secondary | ICD-10-CM | POA: Diagnosis not present

## 2016-08-01 DIAGNOSIS — S72001D Fracture of unspecified part of neck of right femur, subsequent encounter for closed fracture with routine healing: Secondary | ICD-10-CM | POA: Diagnosis not present

## 2016-08-14 DIAGNOSIS — S72041D Displaced fracture of base of neck of right femur, subsequent encounter for closed fracture with routine healing: Secondary | ICD-10-CM | POA: Diagnosis not present

## 2016-08-20 DIAGNOSIS — Z96641 Presence of right artificial hip joint: Secondary | ICD-10-CM | POA: Diagnosis not present

## 2016-08-20 DIAGNOSIS — M25551 Pain in right hip: Secondary | ICD-10-CM | POA: Diagnosis not present

## 2016-08-20 DIAGNOSIS — R531 Weakness: Secondary | ICD-10-CM | POA: Diagnosis not present

## 2016-08-20 DIAGNOSIS — S72001D Fracture of unspecified part of neck of right femur, subsequent encounter for closed fracture with routine healing: Secondary | ICD-10-CM | POA: Diagnosis not present

## 2016-08-27 DIAGNOSIS — Z96641 Presence of right artificial hip joint: Secondary | ICD-10-CM | POA: Diagnosis not present

## 2016-08-27 DIAGNOSIS — S72001D Fracture of unspecified part of neck of right femur, subsequent encounter for closed fracture with routine healing: Secondary | ICD-10-CM | POA: Diagnosis not present

## 2016-08-27 DIAGNOSIS — M25551 Pain in right hip: Secondary | ICD-10-CM | POA: Diagnosis not present

## 2016-08-27 DIAGNOSIS — R531 Weakness: Secondary | ICD-10-CM | POA: Diagnosis not present

## 2016-09-02 DIAGNOSIS — S72001D Fracture of unspecified part of neck of right femur, subsequent encounter for closed fracture with routine healing: Secondary | ICD-10-CM | POA: Diagnosis not present

## 2016-09-02 DIAGNOSIS — R531 Weakness: Secondary | ICD-10-CM | POA: Diagnosis not present

## 2016-09-02 DIAGNOSIS — Z96641 Presence of right artificial hip joint: Secondary | ICD-10-CM | POA: Diagnosis not present

## 2016-09-02 DIAGNOSIS — M25551 Pain in right hip: Secondary | ICD-10-CM | POA: Diagnosis not present

## 2016-09-16 DIAGNOSIS — S72001D Fracture of unspecified part of neck of right femur, subsequent encounter for closed fracture with routine healing: Secondary | ICD-10-CM | POA: Diagnosis not present

## 2016-09-18 DIAGNOSIS — Z5181 Encounter for therapeutic drug level monitoring: Secondary | ICD-10-CM | POA: Diagnosis not present

## 2016-09-18 DIAGNOSIS — M25551 Pain in right hip: Secondary | ICD-10-CM | POA: Diagnosis not present

## 2016-09-18 DIAGNOSIS — R351 Nocturia: Secondary | ICD-10-CM | POA: Diagnosis not present

## 2016-09-18 DIAGNOSIS — R350 Frequency of micturition: Secondary | ICD-10-CM | POA: Diagnosis not present

## 2016-10-01 DIAGNOSIS — E669 Obesity, unspecified: Secondary | ICD-10-CM | POA: Diagnosis not present

## 2016-10-01 DIAGNOSIS — Z79899 Other long term (current) drug therapy: Secondary | ICD-10-CM | POA: Diagnosis not present

## 2016-10-01 DIAGNOSIS — K08109 Complete loss of teeth, unspecified cause, unspecified class: Secondary | ICD-10-CM | POA: Diagnosis not present

## 2016-10-01 DIAGNOSIS — Z6830 Body mass index (BMI) 30.0-30.9, adult: Secondary | ICD-10-CM | POA: Diagnosis not present

## 2016-10-01 DIAGNOSIS — I10 Essential (primary) hypertension: Secondary | ICD-10-CM | POA: Diagnosis not present

## 2016-10-01 DIAGNOSIS — Z0000 Encounter for general adult medical examination without abnormal findings: Secondary | ICD-10-CM | POA: Diagnosis not present

## 2016-10-01 DIAGNOSIS — S7291XS Unspecified fracture of right femur, sequela: Secondary | ICD-10-CM | POA: Diagnosis not present

## 2016-10-01 DIAGNOSIS — E785 Hyperlipidemia, unspecified: Secondary | ICD-10-CM | POA: Diagnosis not present

## 2016-10-01 DIAGNOSIS — Z9181 History of falling: Secondary | ICD-10-CM | POA: Diagnosis not present

## 2016-10-01 DIAGNOSIS — M25551 Pain in right hip: Secondary | ICD-10-CM | POA: Diagnosis not present

## 2016-10-22 DIAGNOSIS — M810 Age-related osteoporosis without current pathological fracture: Secondary | ICD-10-CM | POA: Diagnosis not present

## 2016-10-22 DIAGNOSIS — E559 Vitamin D deficiency, unspecified: Secondary | ICD-10-CM | POA: Diagnosis not present

## 2016-10-22 DIAGNOSIS — S72001D Fracture of unspecified part of neck of right femur, subsequent encounter for closed fracture with routine healing: Secondary | ICD-10-CM | POA: Diagnosis not present

## 2016-10-22 DIAGNOSIS — I10 Essential (primary) hypertension: Secondary | ICD-10-CM | POA: Diagnosis not present

## 2016-10-29 DIAGNOSIS — E7800 Pure hypercholesterolemia, unspecified: Secondary | ICD-10-CM | POA: Diagnosis not present

## 2016-10-29 DIAGNOSIS — I10 Essential (primary) hypertension: Secondary | ICD-10-CM | POA: Diagnosis not present

## 2016-10-29 DIAGNOSIS — Z125 Encounter for screening for malignant neoplasm of prostate: Secondary | ICD-10-CM | POA: Diagnosis not present

## 2016-10-29 DIAGNOSIS — R69 Illness, unspecified: Secondary | ICD-10-CM | POA: Diagnosis not present

## 2016-10-30 DIAGNOSIS — S72001D Fracture of unspecified part of neck of right femur, subsequent encounter for closed fracture with routine healing: Secondary | ICD-10-CM | POA: Diagnosis not present

## 2016-11-07 DIAGNOSIS — M25551 Pain in right hip: Secondary | ICD-10-CM | POA: Diagnosis not present

## 2016-11-07 DIAGNOSIS — M7061 Trochanteric bursitis, right hip: Secondary | ICD-10-CM | POA: Diagnosis not present

## 2016-11-07 DIAGNOSIS — M6281 Muscle weakness (generalized): Secondary | ICD-10-CM | POA: Diagnosis not present

## 2016-11-09 NOTE — Addendum Note (Signed)
Addendum  created 11/09/16 0836 by Duane Boston, MD   Sign clinical note

## 2016-11-20 DIAGNOSIS — M7061 Trochanteric bursitis, right hip: Secondary | ICD-10-CM | POA: Diagnosis not present

## 2016-11-20 DIAGNOSIS — M6281 Muscle weakness (generalized): Secondary | ICD-10-CM | POA: Diagnosis not present

## 2016-11-20 DIAGNOSIS — M25551 Pain in right hip: Secondary | ICD-10-CM | POA: Diagnosis not present

## 2016-12-17 DIAGNOSIS — H524 Presbyopia: Secondary | ICD-10-CM | POA: Diagnosis not present

## 2016-12-24 DIAGNOSIS — Z0101 Encounter for examination of eyes and vision with abnormal findings: Secondary | ICD-10-CM | POA: Diagnosis not present

## 2016-12-27 DIAGNOSIS — R69 Illness, unspecified: Secondary | ICD-10-CM | POA: Diagnosis not present

## 2017-01-19 DIAGNOSIS — R079 Chest pain, unspecified: Secondary | ICD-10-CM | POA: Diagnosis not present

## 2017-01-19 DIAGNOSIS — Z87442 Personal history of urinary calculi: Secondary | ICD-10-CM | POA: Diagnosis not present

## 2017-01-19 DIAGNOSIS — K219 Gastro-esophageal reflux disease without esophagitis: Secondary | ICD-10-CM | POA: Diagnosis not present

## 2017-01-19 DIAGNOSIS — Z79899 Other long term (current) drug therapy: Secondary | ICD-10-CM | POA: Diagnosis not present

## 2017-01-19 DIAGNOSIS — I10 Essential (primary) hypertension: Secondary | ICD-10-CM | POA: Diagnosis not present

## 2017-01-19 DIAGNOSIS — I517 Cardiomegaly: Secondary | ICD-10-CM | POA: Diagnosis not present

## 2017-01-23 DIAGNOSIS — K219 Gastro-esophageal reflux disease without esophagitis: Secondary | ICD-10-CM | POA: Diagnosis not present

## 2017-01-23 DIAGNOSIS — R0982 Postnasal drip: Secondary | ICD-10-CM | POA: Diagnosis not present

## 2017-02-04 DIAGNOSIS — J029 Acute pharyngitis, unspecified: Secondary | ICD-10-CM | POA: Diagnosis not present

## 2017-02-04 DIAGNOSIS — H6123 Impacted cerumen, bilateral: Secondary | ICD-10-CM | POA: Diagnosis not present

## 2017-02-18 DIAGNOSIS — R05 Cough: Secondary | ICD-10-CM | POA: Diagnosis not present

## 2017-03-29 DIAGNOSIS — R69 Illness, unspecified: Secondary | ICD-10-CM | POA: Diagnosis not present

## 2017-04-23 DIAGNOSIS — E7800 Pure hypercholesterolemia, unspecified: Secondary | ICD-10-CM | POA: Diagnosis not present

## 2017-04-23 DIAGNOSIS — I10 Essential (primary) hypertension: Secondary | ICD-10-CM | POA: Diagnosis not present

## 2017-04-23 DIAGNOSIS — E559 Vitamin D deficiency, unspecified: Secondary | ICD-10-CM | POA: Diagnosis not present

## 2017-04-30 DIAGNOSIS — M542 Cervicalgia: Secondary | ICD-10-CM | POA: Diagnosis not present

## 2017-04-30 DIAGNOSIS — Z79899 Other long term (current) drug therapy: Secondary | ICD-10-CM | POA: Diagnosis not present

## 2017-04-30 DIAGNOSIS — Z125 Encounter for screening for malignant neoplasm of prostate: Secondary | ICD-10-CM | POA: Diagnosis not present

## 2017-04-30 DIAGNOSIS — E7800 Pure hypercholesterolemia, unspecified: Secondary | ICD-10-CM | POA: Diagnosis not present

## 2017-04-30 DIAGNOSIS — I10 Essential (primary) hypertension: Secondary | ICD-10-CM | POA: Diagnosis not present

## 2017-04-30 DIAGNOSIS — R69 Illness, unspecified: Secondary | ICD-10-CM | POA: Diagnosis not present

## 2017-05-12 DIAGNOSIS — N200 Calculus of kidney: Secondary | ICD-10-CM | POA: Diagnosis not present

## 2017-05-26 DIAGNOSIS — N210 Calculus in bladder: Secondary | ICD-10-CM | POA: Diagnosis not present

## 2017-05-26 DIAGNOSIS — N200 Calculus of kidney: Secondary | ICD-10-CM | POA: Diagnosis not present

## 2017-06-11 DIAGNOSIS — H25813 Combined forms of age-related cataract, bilateral: Secondary | ICD-10-CM | POA: Diagnosis not present

## 2017-06-11 DIAGNOSIS — H353131 Nonexudative age-related macular degeneration, bilateral, early dry stage: Secondary | ICD-10-CM | POA: Diagnosis not present

## 2017-06-19 DIAGNOSIS — M25551 Pain in right hip: Secondary | ICD-10-CM | POA: Diagnosis not present

## 2017-06-23 DIAGNOSIS — L739 Follicular disorder, unspecified: Secondary | ICD-10-CM | POA: Diagnosis not present

## 2017-06-23 DIAGNOSIS — I10 Essential (primary) hypertension: Secondary | ICD-10-CM | POA: Diagnosis not present

## 2017-06-27 DIAGNOSIS — L723 Sebaceous cyst: Secondary | ICD-10-CM | POA: Diagnosis not present

## 2017-07-04 DIAGNOSIS — R590 Localized enlarged lymph nodes: Secondary | ICD-10-CM | POA: Diagnosis not present

## 2017-07-04 DIAGNOSIS — R51 Headache: Secondary | ICD-10-CM | POA: Diagnosis not present

## 2017-07-04 DIAGNOSIS — M542 Cervicalgia: Secondary | ICD-10-CM | POA: Diagnosis not present

## 2017-07-07 ENCOUNTER — Other Ambulatory Visit (HOSPITAL_COMMUNITY): Payer: Self-pay | Admitting: Internal Medicine

## 2017-07-07 DIAGNOSIS — M542 Cervicalgia: Secondary | ICD-10-CM

## 2017-07-07 DIAGNOSIS — R590 Localized enlarged lymph nodes: Secondary | ICD-10-CM

## 2017-07-09 DIAGNOSIS — R208 Other disturbances of skin sensation: Secondary | ICD-10-CM | POA: Diagnosis not present

## 2017-07-09 DIAGNOSIS — R51 Headache: Secondary | ICD-10-CM | POA: Diagnosis not present

## 2017-07-10 ENCOUNTER — Ambulatory Visit (HOSPITAL_COMMUNITY)
Admission: RE | Admit: 2017-07-10 | Discharge: 2017-07-10 | Disposition: A | Discharge status: Home / Self Care | Payer: Medicare HMO | Source: Ambulatory Visit | Attending: Internal Medicine | Admitting: Internal Medicine

## 2017-07-10 DIAGNOSIS — M542 Cervicalgia: Secondary | ICD-10-CM | POA: Insufficient documentation

## 2017-07-10 DIAGNOSIS — M47812 Spondylosis without myelopathy or radiculopathy, cervical region: Secondary | ICD-10-CM | POA: Insufficient documentation

## 2017-07-10 DIAGNOSIS — R599 Enlarged lymph nodes, unspecified: Secondary | ICD-10-CM | POA: Diagnosis not present

## 2017-07-10 DIAGNOSIS — I700 Atherosclerosis of aorta: Secondary | ICD-10-CM | POA: Diagnosis not present

## 2017-07-10 DIAGNOSIS — R590 Localized enlarged lymph nodes: Secondary | ICD-10-CM | POA: Diagnosis present

## 2017-07-10 LAB — POCT I-STAT CREATININE: Creatinine, Ser: 0.9 mg/dL (ref 0.61–1.24)

## 2017-07-10 MED ORDER — IOPAMIDOL (ISOVUE-300) INJECTION 61%
75.0000 mL | Freq: Once | INTRAVENOUS | Status: AC | PRN
  Administered 2017-07-10: 75 mL via INTRAVENOUS

## 2017-07-10 MED ORDER — IOPAMIDOL (ISOVUE-300) INJECTION 61%
INTRAVENOUS | Status: AC
  Filled 2017-07-10: qty 75

## 2017-07-11 ENCOUNTER — Other Ambulatory Visit (HOSPITAL_COMMUNITY): Payer: Self-pay | Admitting: Internal Medicine

## 2017-07-11 DIAGNOSIS — R519 Headache, unspecified: Secondary | ICD-10-CM

## 2017-07-11 DIAGNOSIS — R69 Illness, unspecified: Secondary | ICD-10-CM | POA: Diagnosis not present

## 2017-07-11 DIAGNOSIS — R51 Headache: Principal | ICD-10-CM

## 2017-07-15 ENCOUNTER — Ambulatory Visit (HOSPITAL_COMMUNITY)
Admission: RE | Admit: 2017-07-15 | Discharge: 2017-07-15 | Disposition: A | Discharge status: Home / Self Care | Payer: Medicare HMO | Source: Ambulatory Visit | Attending: Internal Medicine | Admitting: Internal Medicine

## 2017-07-15 ENCOUNTER — Encounter (HOSPITAL_COMMUNITY): Payer: Self-pay

## 2017-07-15 DIAGNOSIS — R51 Headache: Secondary | ICD-10-CM | POA: Diagnosis present

## 2017-07-15 DIAGNOSIS — G319 Degenerative disease of nervous system, unspecified: Secondary | ICD-10-CM | POA: Diagnosis not present

## 2017-07-15 DIAGNOSIS — R519 Headache, unspecified: Secondary | ICD-10-CM

## 2017-07-21 DIAGNOSIS — L739 Follicular disorder, unspecified: Secondary | ICD-10-CM | POA: Diagnosis not present

## 2017-09-02 DIAGNOSIS — M25551 Pain in right hip: Secondary | ICD-10-CM | POA: Diagnosis not present

## 2017-09-02 DIAGNOSIS — Z96641 Presence of right artificial hip joint: Secondary | ICD-10-CM | POA: Diagnosis not present

## 2017-09-05 DIAGNOSIS — M25551 Pain in right hip: Secondary | ICD-10-CM | POA: Diagnosis not present

## 2017-09-06 DIAGNOSIS — N200 Calculus of kidney: Secondary | ICD-10-CM | POA: Diagnosis not present

## 2017-09-06 DIAGNOSIS — Z791 Long term (current) use of non-steroidal anti-inflammatories (NSAID): Secondary | ICD-10-CM | POA: Diagnosis not present

## 2017-09-06 DIAGNOSIS — M810 Age-related osteoporosis without current pathological fracture: Secondary | ICD-10-CM | POA: Diagnosis not present

## 2017-09-06 DIAGNOSIS — E785 Hyperlipidemia, unspecified: Secondary | ICD-10-CM | POA: Diagnosis not present

## 2017-09-06 DIAGNOSIS — M1990 Unspecified osteoarthritis, unspecified site: Secondary | ICD-10-CM | POA: Diagnosis not present

## 2017-09-06 DIAGNOSIS — G8929 Other chronic pain: Secondary | ICD-10-CM | POA: Diagnosis not present

## 2017-09-06 DIAGNOSIS — K219 Gastro-esophageal reflux disease without esophagitis: Secondary | ICD-10-CM | POA: Diagnosis not present

## 2017-09-06 DIAGNOSIS — Z6830 Body mass index (BMI) 30.0-30.9, adult: Secondary | ICD-10-CM | POA: Diagnosis not present

## 2017-09-06 DIAGNOSIS — E669 Obesity, unspecified: Secondary | ICD-10-CM | POA: Diagnosis not present

## 2017-09-06 DIAGNOSIS — I10 Essential (primary) hypertension: Secondary | ICD-10-CM | POA: Diagnosis not present

## 2017-09-23 DIAGNOSIS — M25551 Pain in right hip: Secondary | ICD-10-CM | POA: Diagnosis not present

## 2017-10-09 DIAGNOSIS — M25551 Pain in right hip: Secondary | ICD-10-CM | POA: Diagnosis not present

## 2017-10-16 DIAGNOSIS — E7800 Pure hypercholesterolemia, unspecified: Secondary | ICD-10-CM | POA: Diagnosis not present

## 2017-10-16 DIAGNOSIS — M542 Cervicalgia: Secondary | ICD-10-CM | POA: Diagnosis not present

## 2017-10-16 DIAGNOSIS — I10 Essential (primary) hypertension: Secondary | ICD-10-CM | POA: Diagnosis not present

## 2017-10-16 DIAGNOSIS — R739 Hyperglycemia, unspecified: Secondary | ICD-10-CM | POA: Diagnosis not present

## 2017-10-17 ENCOUNTER — Other Ambulatory Visit: Payer: Self-pay | Admitting: Internal Medicine

## 2017-10-17 DIAGNOSIS — M542 Cervicalgia: Secondary | ICD-10-CM

## 2017-10-17 DIAGNOSIS — E7800 Pure hypercholesterolemia, unspecified: Secondary | ICD-10-CM | POA: Diagnosis not present

## 2017-10-17 DIAGNOSIS — I10 Essential (primary) hypertension: Secondary | ICD-10-CM | POA: Diagnosis not present

## 2017-10-17 DIAGNOSIS — R42 Dizziness and giddiness: Secondary | ICD-10-CM | POA: Diagnosis not present

## 2017-10-27 ENCOUNTER — Ambulatory Visit
Admission: RE | Admit: 2017-10-27 | Discharge: 2017-10-27 | Disposition: A | Discharge status: Home / Self Care | Payer: Medicare HMO | Source: Ambulatory Visit | Attending: Internal Medicine | Admitting: Internal Medicine

## 2017-10-27 DIAGNOSIS — M542 Cervicalgia: Secondary | ICD-10-CM

## 2017-10-27 DIAGNOSIS — M4802 Spinal stenosis, cervical region: Secondary | ICD-10-CM | POA: Diagnosis not present

## 2017-10-29 DIAGNOSIS — R69 Illness, unspecified: Secondary | ICD-10-CM | POA: Diagnosis not present

## 2017-10-29 DIAGNOSIS — Z125 Encounter for screening for malignant neoplasm of prostate: Secondary | ICD-10-CM | POA: Diagnosis not present

## 2017-10-29 DIAGNOSIS — Z5181 Encounter for therapeutic drug level monitoring: Secondary | ICD-10-CM | POA: Diagnosis not present

## 2017-10-29 DIAGNOSIS — E7800 Pure hypercholesterolemia, unspecified: Secondary | ICD-10-CM | POA: Diagnosis not present

## 2017-10-29 DIAGNOSIS — Z79899 Other long term (current) drug therapy: Secondary | ICD-10-CM | POA: Diagnosis not present

## 2017-10-29 DIAGNOSIS — I10 Essential (primary) hypertension: Secondary | ICD-10-CM | POA: Diagnosis not present

## 2017-11-05 DIAGNOSIS — M542 Cervicalgia: Secondary | ICD-10-CM | POA: Diagnosis not present

## 2017-11-05 DIAGNOSIS — I10 Essential (primary) hypertension: Secondary | ICD-10-CM | POA: Diagnosis not present

## 2017-11-05 DIAGNOSIS — Z0000 Encounter for general adult medical examination without abnormal findings: Secondary | ICD-10-CM | POA: Diagnosis not present

## 2017-11-05 DIAGNOSIS — R739 Hyperglycemia, unspecified: Secondary | ICD-10-CM | POA: Diagnosis not present

## 2017-11-14 DIAGNOSIS — L989 Disorder of the skin and subcutaneous tissue, unspecified: Secondary | ICD-10-CM | POA: Diagnosis not present

## 2017-11-14 DIAGNOSIS — T63301A Toxic effect of unspecified spider venom, accidental (unintentional), initial encounter: Secondary | ICD-10-CM | POA: Diagnosis not present

## 2017-11-14 DIAGNOSIS — Z23 Encounter for immunization: Secondary | ICD-10-CM | POA: Diagnosis not present

## 2017-11-19 DIAGNOSIS — M542 Cervicalgia: Secondary | ICD-10-CM | POA: Diagnosis not present

## 2017-11-19 DIAGNOSIS — M47812 Spondylosis without myelopathy or radiculopathy, cervical region: Secondary | ICD-10-CM | POA: Diagnosis not present

## 2017-11-19 DIAGNOSIS — M5030 Other cervical disc degeneration, unspecified cervical region: Secondary | ICD-10-CM | POA: Diagnosis not present

## 2017-11-19 DIAGNOSIS — M25511 Pain in right shoulder: Secondary | ICD-10-CM | POA: Diagnosis not present

## 2017-11-25 DIAGNOSIS — M25511 Pain in right shoulder: Secondary | ICD-10-CM | POA: Diagnosis not present

## 2017-11-27 DIAGNOSIS — N401 Enlarged prostate with lower urinary tract symptoms: Secondary | ICD-10-CM | POA: Diagnosis not present

## 2017-11-27 DIAGNOSIS — R351 Nocturia: Secondary | ICD-10-CM | POA: Diagnosis not present

## 2017-11-27 DIAGNOSIS — N200 Calculus of kidney: Secondary | ICD-10-CM | POA: Diagnosis not present

## 2017-12-10 DIAGNOSIS — M5030 Other cervical disc degeneration, unspecified cervical region: Secondary | ICD-10-CM | POA: Diagnosis not present

## 2017-12-10 DIAGNOSIS — M47812 Spondylosis without myelopathy or radiculopathy, cervical region: Secondary | ICD-10-CM | POA: Diagnosis not present

## 2017-12-10 DIAGNOSIS — M542 Cervicalgia: Secondary | ICD-10-CM | POA: Diagnosis not present

## 2017-12-10 DIAGNOSIS — M19011 Primary osteoarthritis, right shoulder: Secondary | ICD-10-CM | POA: Diagnosis not present

## 2017-12-18 DIAGNOSIS — S20469S Insect bite (nonvenomous) of unspecified back wall of thorax, sequela: Secondary | ICD-10-CM | POA: Diagnosis not present

## 2017-12-18 DIAGNOSIS — W57XXXD Bitten or stung by nonvenomous insect and other nonvenomous arthropods, subsequent encounter: Secondary | ICD-10-CM | POA: Diagnosis not present

## 2018-01-16 DIAGNOSIS — R69 Illness, unspecified: Secondary | ICD-10-CM | POA: Diagnosis not present

## 2018-02-17 DIAGNOSIS — M25551 Pain in right hip: Secondary | ICD-10-CM | POA: Diagnosis not present

## 2018-02-20 DIAGNOSIS — Z23 Encounter for immunization: Secondary | ICD-10-CM | POA: Diagnosis not present

## 2018-02-25 DIAGNOSIS — M1611 Unilateral primary osteoarthritis, right hip: Secondary | ICD-10-CM | POA: Diagnosis not present

## 2018-02-25 DIAGNOSIS — Z01818 Encounter for other preprocedural examination: Secondary | ICD-10-CM | POA: Diagnosis not present

## 2018-03-09 ENCOUNTER — Other Ambulatory Visit: Payer: Self-pay | Admitting: Orthopedic Surgery

## 2018-03-13 ENCOUNTER — Other Ambulatory Visit: Payer: Self-pay | Admitting: Orthopedic Surgery

## 2018-03-23 NOTE — Patient Instructions (Signed)
Roger Saunders  03/23/2018   Your procedure is scheduled on: 03-30-18   Report to [facility] Main  Entrance    Report to admitting at 5:30AM    Call this number if you have problems the morning of surgery [phone]     Remember: Do not eat food or drink liquids :After Midnight. BRUSH YOUR TEETH MORNING OF SURGERY AND RINSE YOUR MOUTH OUT, NO CHEWING GUM CANDY OR MINTS.     Take these medicines the morning of surgery with A SIP OF WATER: NONE                                You may not have any metal on your body including hair pins and              piercings  Do not wear jewelry, make-up, lotions, powders or perfumes, deodorant                       Men may shave face and neck.   Do not bring valuables to the hospital. [place].  Contacts, dentures or bridgework may not be worn into surgery.  Leave suitcase in the car. After surgery it may be brought to your room.                 Please read over the following fact sheets you were given: _____________________________________________________________________             [facility] - Preparing for Surgery Before surgery, you can play an important role.  Because skin is not sterile, your skin needs to be as free of germs as possible.  You can reduce the number of germs on your skin by washing with CHG (chlorahexidine gluconate) soap before surgery.  CHG is an antiseptic cleaner which kills germs and bonds with the skin to continue killing germs even after washing. Please DO NOT use if you have an allergy to CHG or antibacterial soaps.  If your skin becomes reddened/irritated stop using the CHG and inform your nurse when you arrive at Short Stay. Do not shave (including legs and underarms) for at least 48 hours prior to the first CHG shower.  You may shave your face/neck. Please follow these instructions carefully:  1.  Shower with CHG Soap the night  before surgery and the  morning of Surgery.  2.  If you choose to wash your hair, wash your hair first as usual with your  normal  shampoo.  3.  After you shampoo, rinse your hair and body thoroughly to remove the  shampoo.                           4.  Use CHG as you would any other liquid soap.  You can apply chg directly  to the skin and wash                       Gently with a scrungie or clean washcloth.  5.  Apply the CHG Soap to your body ONLY FROM THE NECK DOWN.   Do not use on face/ open  Wound or open sores. Avoid contact with eyes, ears mouth and genitals (private parts).                       Wash face,  Genitals (private parts) with your normal soap.             6.  Wash thoroughly, paying special attention to the area where your surgery  will be performed.  7.  Thoroughly rinse your body with warm water from the neck down.  8.  DO NOT shower/wash with your normal soap after using and rinsing off  the CHG Soap.                9.  Pat yourself dry with a clean towel.            10.  Wear clean pajamas.            11.  Place clean sheets on your bed the night of your first shower and do not  sleep with pets. Day of Surgery : Do not apply any lotions/deodorants the morning of surgery.  Please wear clean clothes to the hospital/surgery center.  FAILURE TO FOLLOW THESE INSTRUCTIONS MAY RESULT IN THE CANCELLATION OF YOUR SURGERY PATIENT SIGNATURE_________________________________  NURSE SIGNATURE__________________________________  ________________________________________________________________________   Roger Saunders  An incentive spirometer is a tool that can help keep your lungs clear and active. This tool measures how well you are filling your lungs with each breath. Taking long deep breaths may help reverse or decrease the chance of developing breathing (pulmonary) problems (especially infection) following:  A long period of time when you are  unable to move or be active. BEFORE THE PROCEDURE   If the spirometer includes an indicator to show your best effort, your nurse or respiratory therapist will set it to a desired goal.  If possible, sit up straight or lean slightly forward. Try not to slouch.  Hold the incentive spirometer in an upright position. INSTRUCTIONS FOR USE  1. Sit on the edge of your bed if possible, or sit up as far as you can in bed or on a chair. 2. Hold the incentive spirometer in an upright position. 3. Breathe out normally. 4. Place the mouthpiece in your mouth and seal your lips tightly around it. 5. Breathe in slowly and as deeply as possible, raising the piston or the ball toward the top of the column. 6. Hold your breath for 3-5 seconds or for as long as possible. Allow the piston or ball to fall to the bottom of the column. 7. Remove the mouthpiece from your mouth and breathe out normally. 8. Rest for a few seconds and repeat Steps 1 through 7 at least 10 times every 1-2 hours when you are awake. Take your time and take a few normal breaths between deep breaths. 9. The spirometer may include an indicator to show your best effort. Use the indicator as a goal to work toward during each repetition. 10. After each set of 10 deep breaths, practice coughing to be sure your lungs are clear. If you have an incision (the cut made at the time of surgery), support your incision when coughing by placing a pillow or rolled up towels firmly against it. Once you are able to get out of bed, walk around indoors and cough well. You may stop using the incentive spirometer when instructed by your caregiver.  RISKS AND COMPLICATIONS  Take your time so you do not get  dizzy or light-headed.  If you are in pain, you may need to take or ask for pain medication before doing incentive spirometry. It is harder to take a deep breath if you are having pain. AFTER USE  Rest and breathe slowly and easily.  It can be helpful to  keep track of a log of your progress. Your caregiver can provide you with a simple table to help with this. If you are using the spirometer at home, follow these instructions: [place] IF:   You are having difficultly using the spirometer.  You have trouble using the spirometer as often as instructed.  Your pain medication is not giving enough relief while using the spirometer.  You develop fever of 100.5 F (38.1 C) or higher. SEEK IMMEDIATE MEDICAL CARE IF:   You cough up bloody sputum that had not been present before.  You develop fever of 102 F (38.9 C) or greater.  You develop worsening pain at or near the incision site. MAKE SURE YOU:   Understand these instructions.  Will watch your condition.  Will get help right away if you are not doing well or get worse. Document Released: 10/07/2006 Document Revised: 08/19/2011 Document Reviewed: 12/08/2006 [facility] Patient Information 2014 [place], [place].   ________________________________________________________________________

## 2018-03-24 ENCOUNTER — Ambulatory Visit (HOSPITAL_COMMUNITY)
Admission: RE | Admit: 2018-03-24 | Discharge: 2018-03-24 | Disposition: A | Discharge status: Home / Self Care | Payer: Medicare HMO | Source: Ambulatory Visit | Attending: Orthopedic Surgery | Admitting: Orthopedic Surgery

## 2018-03-24 ENCOUNTER — Other Ambulatory Visit: Payer: Self-pay

## 2018-03-24 ENCOUNTER — Encounter (HOSPITAL_COMMUNITY)
Admission: RE | Admit: 2018-03-24 | Discharge: 2018-03-24 | Disposition: A | Discharge status: Home / Self Care | Payer: Medicare HMO | Source: Ambulatory Visit | Attending: Orthopedic Surgery | Admitting: Orthopedic Surgery

## 2018-03-24 ENCOUNTER — Encounter (HOSPITAL_COMMUNITY): Payer: Self-pay

## 2018-03-24 DIAGNOSIS — Z01818 Encounter for other preprocedural examination: Secondary | ICD-10-CM | POA: Diagnosis present

## 2018-03-24 DIAGNOSIS — R0989 Other specified symptoms and signs involving the circulatory and respiratory systems: Secondary | ICD-10-CM | POA: Diagnosis not present

## 2018-03-24 LAB — URINALYSIS, ROUTINE W REFLEX MICROSCOPIC
Bilirubin Urine: NEGATIVE
Glucose, UA: NEGATIVE mg/dL
Hgb urine dipstick: NEGATIVE
KETONES UR: NEGATIVE mg/dL
Leukocytes, UA: NEGATIVE
NITRITE: NEGATIVE
PROTEIN: NEGATIVE mg/dL
Specific Gravity, Urine: 1.017 (ref 1.005–1.030)
pH: 5 (ref 5.0–8.0)

## 2018-03-24 LAB — BASIC METABOLIC PANEL
Anion gap: 8 (ref 5–15)
BUN: 16 mg/dL (ref 8–23)
CALCIUM: 8.8 mg/dL — AB (ref 8.9–10.3)
CO2: 25 mmol/L (ref 22–32)
CREATININE: 0.79 mg/dL (ref 0.61–1.24)
Chloride: 105 mmol/L (ref 98–111)
GFR calc non Af Amer: >60 mL/min (ref >60)
Glucose, Bld: 143 mg/dL — ABNORMAL HIGH (ref 70–99)
Potassium: 3.9 mmol/L (ref 3.5–5.1)
SODIUM: 138 mmol/L (ref 135–145)

## 2018-03-24 LAB — CBC WITH DIFFERENTIAL/PLATELET
Abs Immature Granulocytes: 0.04 10*3/uL (ref 0.00–0.07)
Basophils Absolute: 0 10*3/uL (ref 0.0–0.1)
Basophils Relative: 1 %
EOS ABS: 0.1 10*3/uL (ref 0.0–0.5)
EOS PCT: 1 %
HEMATOCRIT: 44.5 % (ref 39.0–52.0)
HEMOGLOBIN: 14.6 g/dL (ref 13.0–17.0)
Immature Granulocytes: 1 %
LYMPHS ABS: 2.5 10*3/uL (ref 0.7–4.0)
Lymphocytes Relative: 30 %
MCH: 33.4 pg (ref 26.0–34.0)
MCHC: 32.8 g/dL (ref 30.0–36.0)
MCV: 101.8 fL — ABNORMAL HIGH (ref 80.0–100.0)
MONO ABS: 0.6 10*3/uL (ref 0.1–1.0)
Monocytes Relative: 7 %
NRBC: 0 % (ref 0.0–0.2)
Neutro Abs: 4.9 10*3/uL (ref 1.7–7.7)
Neutrophils Relative %: 60 %
Platelets: 170 10*3/uL (ref 150–400)
RBC: 4.37 MIL/uL (ref 4.22–5.81)
RDW: 12 % (ref 11.5–15.5)
WBC: 8.1 10*3/uL (ref 4.0–10.5)

## 2018-03-24 LAB — PROTIME-INR
INR: 0.94
Prothrombin Time: 12.5 seconds (ref 11.4–15.2)

## 2018-03-24 LAB — SURGICAL PCR SCREEN
MRSA, PCR: NEGATIVE
STAPHYLOCOCCUS AUREUS: NEGATIVE

## 2018-03-24 LAB — APTT: aPTT: 27 seconds (ref 24–36)

## 2018-03-24 NOTE — Progress Notes (Signed)
CHART LEFT WITH VERNA WASHINGTON, RN TO F/U

## 2018-03-26 DIAGNOSIS — Z96649 Presence of unspecified artificial hip joint: Secondary | ICD-10-CM

## 2018-03-26 DIAGNOSIS — T84018A Broken internal joint prosthesis, other site, initial encounter: Secondary | ICD-10-CM

## 2018-03-26 NOTE — H&P (Signed)
TOTAL HIP REVISION ADMISSION H&P  Patient is admitted for right revision total hip arthroplasty.  Subjective:  Chief Complaint: right hip pain  HPI: Roger Saunders, 84 y.o. male, has a history of pain and functional disability in the right hip due to trauma and arthritis and patient has failed non-surgical conservative treatments for greater than 12 weeks to include NSAID's and/or analgesics, flexibility and strengthening excercises, weight reduction as appropriate and activity modification. The indications for the revision total hip arthroplasty are Painful right monopolar.  Onset of symptoms was gradual starting 1 years ago with gradually worsening course since that time.  Prior procedures on the right hip include hemi-arthroplasty.  Patient currently rates pain in the right hip at 10 out of 10 with activity.  There is worsening of pain with activity and weight bearing, trendelenberg gait and pain that interfers with activities of daily living. Patient has evidence of joint space narrowing by imaging studies.  This condition presents safety issues increasing the risk of falls.   There is no current active infection.  Patient Active Problem List   Diagnosis Date Noted  . Hip fracture, right ([place]) 07/30/2016  . Closed right hip fracture ([place]) 07/01/2016  . GERD (gastroesophageal reflux disease) 07/01/2016  . Essential hypertension   . Arthritis   . Fall    Past Medical History:  Diagnosis Date  . Arthritis   . Conjunctivitis, chronic   . Depression    situational  . GERD (gastroesophageal reflux disease)    occasionally  . History of kidney stones   . Hypertension     Past Surgical History:  Procedure Laterality Date  . APPENDECTOMY  1938  . COLONOSCOPY    . HIP PINNING,CANNULATED Right 07/02/2016   Procedure: CANNULATED HIP PINNING;  Surgeon: Renette Butters, MD;  Location: [place];  Service: Orthopedics;  Laterality: Right;  . KIDNEY STONE SURGERY    . TOTAL HIP ARTHROPLASTY  Right 07/30/2016   Procedure: RIGHT HEMI HIP ARTHROPLASTY;  Surgeon: Renette Butters, MD;  Location: [place];  Service: Orthopedics;  Laterality: Right;    No current facility-administered medications for this encounter.    Current Outpatient Medications  Medication Sig Dispense Refill Last Dose  . amLODipine (NORVASC) 5 MG tablet Take 5 mg by mouth every evening.    07/29/2016 at Unknown time  . Calcium Carbonate (CALCIUM 600 PO) Take 600 mg by mouth 2 (two) times daily.     . calcium elemental as carbonate (TUMS ULTRA 1000) 400 MG chewable tablet Chew 2,000 mg by mouth daily as needed for heartburn.     . Cholecalciferol (VITAMIN D3) 2000 units TABS Take 4,000 Units by mouth every evening.     Marland Kitchen HYDROcodone-acetaminophen (NORCO) 5-325 MG tablet Take 1-2 tablets by mouth every 4 (four) hours as needed for moderate pain. (Patient taking differently: Take 1 tablet by mouth 3 (three) times daily as needed for moderate pain. ) 40 tablet 0   . losartan (COZAAR) 100 MG tablet Take 100 mg by mouth every evening.    07/29/2016 at Unknown time  . Polyethyl Glycol-Propyl Glycol (SYSTANE) 0.4-0.3 % SOLN Place 1 drop into both eyes daily as needed (for dry eyes).      Allergies  Allergen Reactions  . Lipitor [Atorvastatin Calcium] Other (See Comments)    Legs hurt    Social History   Tobacco Use  . Smoking status: Former Smoker    Types: Pipe, Cigars    Last attempt to quit: 2007  Years since quitting: 12.8  . Smokeless tobacco: Never Used  Substance Use Topics  . Alcohol use: Yes    Alcohol/week: 7.0 standard drinks    Types: 7 Glasses of wine per week    Comment: "a litle bit of red wine"    Family History  Problem Relation Age of Onset  . Heart attack Mother   . Prostate cancer Father   . Parkinson's disease Brother       Review of Systems  Constitutional: Negative.   HENT: Negative.   Eyes: Negative.   Respiratory: Negative.   Cardiovascular:       Htn  Gastrointestinal:  Positive for heartburn.  Genitourinary:       Kidney stones  Musculoskeletal: Positive for joint pain.  Skin: Negative.   Neurological: Negative.   Endo/Heme/Allergies: Negative.   Psychiatric/Behavioral: Negative.     Objective:  Physical Exam  Constitutional: He is oriented to person, place, and time. He appears well-developed and well-nourished.  HENT:  Head: Normocephalic and atraumatic.  Neck: Normal range of motion. Neck supple.  Cardiovascular: Intact distal pulses.  Respiratory: Effort normal.  Musculoskeletal:  Surgical scar is well-healed internal next rotation, 30 each foot tap is negative.  Neurovascular intact.  He does have irritability to internal rotation.  Neurological: He is alert and oriented to person, place, and time.  Skin: Skin is warm and dry.  Psychiatric: He has a normal mood and affect. His behavior is normal. Judgment and thought content normal.    Vital signs in last 24 hours:     Labs:   Estimated body mass index is 30.11 kg/m as calculated from the following:   Height as of 03/24/18: 5\' 8"  (1.727 m).   Weight as of 03/24/18: 89.8 kg.  Imaging Review:  Plain radiographs demonstrate  AP of the pelvis and crosstable lateral of the right hip are taken and reviewed in office today.  This shows a right hemi-hip arthroplasty with monopolar head.  The superior weightbearing surface of the right hip does appear to have diminished.   Preoperative templating of the joint replacement has been completed, documented, and submitted to the Operating Room personnel in order to optimize intra-operative equipment management.   Assessment/Plan:  End stage arthritis, right hip(s) with failed previous arthroplasty.  The patient history, physical examination, clinical judgement of the provider and imaging studies are consistent with end stage degenerative joint disease of the right hip(s), previous total hip arthroplasty. Revision total hip arthroplasty is  deemed medically necessary. The treatment options including medical management, injection therapy, arthroscopy and arthroplasty were discussed at length. The risks and benefits of total hip arthroplasty were presented and reviewed. The risks due to aseptic loosening, infection, stiffness, dislocation/subluxation,  thromboembolic complications and other imponderables were discussed.  The patient acknowledged the explanation, agreed to proceed with the plan and consent was signed. Patient is being admitted for inpatient treatment for surgery, pain control, PT, OT, prophylactic antibiotics, VTE prophylaxis, progressive ambulation and ADL's and discharge planning. The patient is planning to be discharged home with home health services.

## 2018-03-29 MED ORDER — BUPIVACAINE LIPOSOME 1.3 % IJ SUSP
10.0000 mL | INTRAMUSCULAR | Status: DC
  Filled 2018-03-29: qty 20

## 2018-03-29 MED ORDER — TRANEXAMIC ACID 1000 MG/10ML IV SOLN
2000.0000 mg | INTRAVENOUS | Status: DC
  Filled 2018-03-29: qty 20

## 2018-03-30 ENCOUNTER — Encounter (HOSPITAL_COMMUNITY)
Admission: RE | Disposition: A | Discharge status: Home Health | Payer: Self-pay | Source: Home / Self Care | Attending: Orthopedic Surgery

## 2018-03-30 ENCOUNTER — Encounter (HOSPITAL_COMMUNITY): Payer: Self-pay | Admitting: Emergency Medicine

## 2018-03-30 ENCOUNTER — Inpatient Hospital Stay (HOSPITAL_COMMUNITY)
Admission: RE | Admit: 2018-03-30 | Discharge: 2018-03-31 | DRG: 467 | Disposition: A | Discharge status: Home Health | Payer: Medicare HMO | Attending: Orthopedic Surgery | Admitting: Orthopedic Surgery

## 2018-03-30 ENCOUNTER — Inpatient Hospital Stay (HOSPITAL_COMMUNITY): Payer: Medicare HMO | Admitting: Anesthesiology

## 2018-03-30 ENCOUNTER — Other Ambulatory Visit: Payer: Self-pay

## 2018-03-30 ENCOUNTER — Inpatient Hospital Stay (HOSPITAL_COMMUNITY): Payer: Medicare HMO

## 2018-03-30 DIAGNOSIS — D62 Acute posthemorrhagic anemia: Secondary | ICD-10-CM | POA: Diagnosis not present

## 2018-03-30 DIAGNOSIS — Y792 Prosthetic and other implants, materials and accessory orthopedic devices associated with adverse incidents: Secondary | ICD-10-CM | POA: Diagnosis present

## 2018-03-30 DIAGNOSIS — Z87891 Personal history of nicotine dependence: Secondary | ICD-10-CM

## 2018-03-30 DIAGNOSIS — R69 Illness, unspecified: Secondary | ICD-10-CM | POA: Diagnosis not present

## 2018-03-30 DIAGNOSIS — Z6830 Body mass index (BMI) 30.0-30.9, adult: Secondary | ICD-10-CM | POA: Diagnosis not present

## 2018-03-30 DIAGNOSIS — Z79899 Other long term (current) drug therapy: Secondary | ICD-10-CM | POA: Diagnosis not present

## 2018-03-30 DIAGNOSIS — F329 Major depressive disorder, single episode, unspecified: Secondary | ICD-10-CM | POA: Diagnosis present

## 2018-03-30 DIAGNOSIS — T84018A Broken internal joint prosthesis, other site, initial encounter: Secondary | ICD-10-CM | POA: Diagnosis not present

## 2018-03-30 DIAGNOSIS — Z888 Allergy status to other drugs, medicaments and biological substances status: Secondary | ICD-10-CM | POA: Diagnosis not present

## 2018-03-30 DIAGNOSIS — Z96641 Presence of right artificial hip joint: Secondary | ICD-10-CM | POA: Diagnosis not present

## 2018-03-30 DIAGNOSIS — Z96649 Presence of unspecified artificial hip joint: Secondary | ICD-10-CM

## 2018-03-30 DIAGNOSIS — M1611 Unilateral primary osteoarthritis, right hip: Secondary | ICD-10-CM | POA: Diagnosis present

## 2018-03-30 DIAGNOSIS — I10 Essential (primary) hypertension: Secondary | ICD-10-CM | POA: Diagnosis present

## 2018-03-30 DIAGNOSIS — K219 Gastro-esophageal reflux disease without esophagitis: Secondary | ICD-10-CM | POA: Diagnosis present

## 2018-03-30 DIAGNOSIS — Z87442 Personal history of urinary calculi: Secondary | ICD-10-CM | POA: Diagnosis not present

## 2018-03-30 DIAGNOSIS — E669 Obesity, unspecified: Secondary | ICD-10-CM | POA: Diagnosis present

## 2018-03-30 DIAGNOSIS — T8484XA Pain due to internal orthopedic prosthetic devices, implants and grafts, initial encounter: Principal | ICD-10-CM | POA: Diagnosis present

## 2018-03-30 DIAGNOSIS — M25551 Pain in right hip: Secondary | ICD-10-CM | POA: Diagnosis present

## 2018-03-30 DIAGNOSIS — T84090A Other mechanical complication of internal right hip prosthesis, initial encounter: Secondary | ICD-10-CM | POA: Diagnosis present

## 2018-03-30 DIAGNOSIS — Z471 Aftercare following joint replacement surgery: Secondary | ICD-10-CM | POA: Diagnosis not present

## 2018-03-30 HISTORY — PX: TOTAL HIP REVISION: SHX763

## 2018-03-30 LAB — TYPE AND SCREEN
ABO/RH(D): A POS
Antibody Screen: NEGATIVE

## 2018-03-30 SURGERY — TOTAL HIP REVISION
Anesthesia: Spinal | Site: Hip | Laterality: Right

## 2018-03-30 MED ORDER — CEFAZOLIN SODIUM-DEXTROSE 2-4 GM/100ML-% IV SOLN
2.0000 g | INTRAVENOUS | Status: AC
  Administered 2018-03-30: 2 g via INTRAVENOUS

## 2018-03-30 MED ORDER — SUCCINYLCHOLINE CHLORIDE 200 MG/10ML IV SOSY
PREFILLED_SYRINGE | INTRAVENOUS | Status: AC
  Filled 2018-03-30: qty 10

## 2018-03-30 MED ORDER — ONDANSETRON HCL 4 MG/2ML IJ SOLN
INTRAMUSCULAR | Status: DC | PRN
  Administered 2018-03-30: 4 mg via INTRAVENOUS

## 2018-03-30 MED ORDER — CALCIUM CARBONATE ANTACID 500 MG PO CHEW: 2000.0000 mg | CHEWABLE_TABLET | Freq: Every day | ORAL | Status: DC | PRN

## 2018-03-30 MED ORDER — ASPIRIN 81 MG PO CHEW
81.0000 mg | CHEWABLE_TABLET | Freq: Two times a day (BID) | ORAL | Status: DC
  Administered 2018-03-30 – 2018-03-31 (×2): 81 mg via ORAL
  Filled 2018-03-30 (×2): qty 1

## 2018-03-30 MED ORDER — ONDANSETRON HCL 4 MG/2ML IJ SOLN
INTRAMUSCULAR | Status: AC
  Filled 2018-03-30: qty 2

## 2018-03-30 MED ORDER — ONDANSETRON HCL 4 MG PO TABS: 4.0000 mg | ORAL_TABLET | Freq: Four times a day (QID) | ORAL | Status: DC | PRN

## 2018-03-30 MED ORDER — ASPIRIN EC 81 MG PO TBEC: 81.0000 mg | DELAYED_RELEASE_TABLET | Freq: Two times a day (BID) | ORAL | 0 refills | Status: AC

## 2018-03-30 MED ORDER — GABAPENTIN 300 MG PO CAPS
300.0000 mg | ORAL_CAPSULE | Freq: Three times a day (TID) | ORAL | Status: DC
  Administered 2018-03-30 – 2018-03-31 (×4): 300 mg via ORAL
  Filled 2018-03-30 (×4): qty 1

## 2018-03-30 MED ORDER — FENTANYL CITRATE (PF) 100 MCG/2ML IJ SOLN
INTRAMUSCULAR | Status: AC
  Filled 2018-03-30: qty 2

## 2018-03-30 MED ORDER — DEXAMETHASONE SODIUM PHOSPHATE 10 MG/ML IJ SOLN
10.0000 mg | Freq: Once | INTRAMUSCULAR | Status: AC
  Administered 2018-03-31: 10 mg via INTRAVENOUS
  Filled 2018-03-30: qty 1

## 2018-03-30 MED ORDER — TRANEXAMIC ACID-NACL 1000-0.7 MG/100ML-% IV SOLN
INTRAVENOUS | Status: AC
  Filled 2018-03-30: qty 100

## 2018-03-30 MED ORDER — METOCLOPRAMIDE HCL 5 MG/ML IJ SOLN: 5.0000 mg | Freq: Three times a day (TID) | INTRAMUSCULAR | Status: DC | PRN

## 2018-03-30 MED ORDER — TRANEXAMIC ACID-NACL 1000-0.7 MG/100ML-% IV SOLN: 1000.0000 mg | INTRAVENOUS | Status: DC

## 2018-03-30 MED ORDER — SODIUM CHLORIDE 0.9 % IV SOLN
INTRAVENOUS | Status: DC | PRN
  Administered 2018-03-30: 35 ug/min via INTRAVENOUS

## 2018-03-30 MED ORDER — KCL IN DEXTROSE-NACL 20-5-0.45 MEQ/L-%-% IV SOLN
INTRAVENOUS | Status: DC
  Administered 2018-03-30 – 2018-03-31 (×3): via INTRAVENOUS
  Filled 2018-03-30 (×4): qty 1000

## 2018-03-30 MED ORDER — PHENOL 1.4 % MT LIQD: 1.0000 | OROMUCOSAL | Status: DC | PRN

## 2018-03-30 MED ORDER — 0.9 % SODIUM CHLORIDE (POUR BTL) OPTIME
TOPICAL | Status: DC | PRN
  Administered 2018-03-30: 1000 mL

## 2018-03-30 MED ORDER — PANTOPRAZOLE SODIUM 40 MG PO TBEC
40.0000 mg | DELAYED_RELEASE_TABLET | Freq: Every day | ORAL | Status: DC
  Administered 2018-03-31: 40 mg via ORAL
  Filled 2018-03-30: qty 1

## 2018-03-30 MED ORDER — PROPOFOL 500 MG/50ML IV EMUL
INTRAVENOUS | Status: DC | PRN
  Administered 2018-03-30: 50 ug/kg/min via INTRAVENOUS

## 2018-03-30 MED ORDER — BUPIVACAINE LIPOSOME 1.3 % IJ SUSP
INTRAMUSCULAR | Status: DC | PRN
  Administered 2018-03-30: 20 mL

## 2018-03-30 MED ORDER — METHOCARBAMOL 500 MG IVPB - SIMPLE MED
500.0000 mg | Freq: Four times a day (QID) | INTRAVENOUS | Status: DC | PRN
  Filled 2018-03-30: qty 50

## 2018-03-30 MED ORDER — PROPOFOL 10 MG/ML IV BOLUS
INTRAVENOUS | Status: AC
  Filled 2018-03-30: qty 20

## 2018-03-30 MED ORDER — TRANEXAMIC ACID-NACL 1000-0.7 MG/100ML-% IV SOLN
1000.0000 mg | Freq: Once | INTRAVENOUS | Status: AC
  Administered 2018-03-30: 1000 mg via INTRAVENOUS
  Filled 2018-03-30: qty 100

## 2018-03-30 MED ORDER — POLYETHYLENE GLYCOL 3350 17 G PO PACK: 17.0000 g | PACK | Freq: Every day | ORAL | Status: DC | PRN

## 2018-03-30 MED ORDER — SODIUM CHLORIDE 0.9 % IJ SOLN
INTRAMUSCULAR | Status: AC
  Filled 2018-03-30: qty 20

## 2018-03-30 MED ORDER — CHLORHEXIDINE GLUCONATE 4 % EX LIQD: 60.0000 mL | Freq: Once | CUTANEOUS | Status: DC

## 2018-03-30 MED ORDER — ALUM & MAG HYDROXIDE-SIMETH 200-200-20 MG/5ML PO SUSP: 30.0000 mL | ORAL | Status: DC | PRN

## 2018-03-30 MED ORDER — ONDANSETRON HCL 4 MG/2ML IJ SOLN: 4.0000 mg | Freq: Four times a day (QID) | INTRAMUSCULAR | Status: DC | PRN

## 2018-03-30 MED ORDER — FENTANYL CITRATE (PF) 100 MCG/2ML IJ SOLN
INTRAMUSCULAR | Status: DC | PRN
  Administered 2018-03-30 (×4): 25 ug via INTRAVENOUS

## 2018-03-30 MED ORDER — LACTATED RINGERS IV SOLN
INTRAVENOUS | Status: DC
  Administered 2018-03-30 (×3): via INTRAVENOUS

## 2018-03-30 MED ORDER — TIZANIDINE HCL 2 MG PO TABS: 2.0000 mg | ORAL_TABLET | Freq: Four times a day (QID) | ORAL | 0 refills | Status: AC | PRN

## 2018-03-30 MED ORDER — PHENYLEPHRINE HCL 10 MG/ML IJ SOLN
INTRAMUSCULAR | Status: AC
  Filled 2018-03-30: qty 1

## 2018-03-30 MED ORDER — METOCLOPRAMIDE HCL 5 MG PO TABS: 5.0000 mg | ORAL_TABLET | Freq: Three times a day (TID) | ORAL | Status: DC | PRN

## 2018-03-30 MED ORDER — MENTHOL 3 MG MT LOZG: 1.0000 | LOZENGE | OROMUCOSAL | Status: DC | PRN

## 2018-03-30 MED ORDER — LOSARTAN POTASSIUM 50 MG PO TABS
100.0000 mg | ORAL_TABLET | Freq: Every evening | ORAL | Status: DC
  Administered 2018-03-30: 100 mg via ORAL
  Filled 2018-03-30: qty 2

## 2018-03-30 MED ORDER — BUPIVACAINE HCL (PF) 0.25 % IJ SOLN
INTRAMUSCULAR | Status: AC
  Filled 2018-03-30: qty 30

## 2018-03-30 MED ORDER — TRANEXAMIC ACID-NACL 1000-0.7 MG/100ML-% IV SOLN
1000.0000 mg | INTRAVENOUS | Status: AC
  Administered 2018-03-30: 1000 mg via INTRAVENOUS

## 2018-03-30 MED ORDER — LIDOCAINE 2% (20 MG/ML) 5 ML SYRINGE
INTRAMUSCULAR | Status: AC
  Filled 2018-03-30: qty 10

## 2018-03-30 MED ORDER — FENTANYL CITRATE (PF) 100 MCG/2ML IJ SOLN: 25.0000 ug | INTRAMUSCULAR | Status: DC | PRN

## 2018-03-30 MED ORDER — FLEET ENEMA 7-19 GM/118ML RE ENEM: 1.0000 | ENEMA | Freq: Once | RECTAL | Status: DC | PRN

## 2018-03-30 MED ORDER — BUPIVACAINE LIPOSOME 1.3 % IJ SUSP
20.0000 mL | Freq: Once | INTRAMUSCULAR | Status: DC
  Filled 2018-03-30: qty 20

## 2018-03-30 MED ORDER — TRANEXAMIC ACID 1000 MG/10ML IV SOLN
INTRAVENOUS | Status: DC | PRN
  Administered 2018-03-30: 2000 mg via TOPICAL

## 2018-03-30 MED ORDER — ACETAMINOPHEN 500 MG PO TABS
1000.0000 mg | ORAL_TABLET | Freq: Four times a day (QID) | ORAL | Status: AC
  Administered 2018-03-30 – 2018-03-31 (×4): 1000 mg via ORAL
  Filled 2018-03-30 (×4): qty 2

## 2018-03-30 MED ORDER — AMLODIPINE BESYLATE 5 MG PO TABS
5.0000 mg | ORAL_TABLET | Freq: Every evening | ORAL | Status: DC
  Administered 2018-03-30: 5 mg via ORAL
  Filled 2018-03-30: qty 1

## 2018-03-30 MED ORDER — POLYVINYL ALCOHOL 1.4 % OP SOLN: 1.0000 [drp] | OPHTHALMIC | Status: DC | PRN

## 2018-03-30 MED ORDER — ONDANSETRON HCL 4 MG/2ML IJ SOLN: 4.0000 mg | Freq: Once | INTRAMUSCULAR | Status: DC | PRN

## 2018-03-30 MED ORDER — PHENYLEPHRINE 40 MCG/ML (10ML) SYRINGE FOR IV PUSH (FOR BLOOD PRESSURE SUPPORT)
PREFILLED_SYRINGE | INTRAVENOUS | Status: AC
  Filled 2018-03-30: qty 20

## 2018-03-30 MED ORDER — PHENYLEPHRINE 40 MCG/ML (10ML) SYRINGE FOR IV PUSH (FOR BLOOD PRESSURE SUPPORT)
PREFILLED_SYRINGE | INTRAVENOUS | Status: DC | PRN
  Administered 2018-03-30: 120 ug via INTRAVENOUS

## 2018-03-30 MED ORDER — BUPIVACAINE IN DEXTROSE 0.75-8.25 % IT SOLN
INTRATHECAL | Status: DC | PRN
  Administered 2018-03-30: 2 mL via INTRATHECAL

## 2018-03-30 MED ORDER — DOCUSATE SODIUM 100 MG PO CAPS
100.0000 mg | ORAL_CAPSULE | Freq: Two times a day (BID) | ORAL | Status: DC
  Administered 2018-03-30 – 2018-03-31 (×2): 100 mg via ORAL
  Filled 2018-03-30 (×2): qty 1

## 2018-03-30 MED ORDER — ACETAMINOPHEN 325 MG PO TABS
325.0000 mg | ORAL_TABLET | Freq: Four times a day (QID) | ORAL | Status: DC | PRN
  Filled 2018-03-30: qty 2

## 2018-03-30 MED ORDER — CELECOXIB 200 MG PO CAPS
200.0000 mg | ORAL_CAPSULE | Freq: Two times a day (BID) | ORAL | Status: DC
  Administered 2018-03-30 – 2018-03-31 (×2): 200 mg via ORAL
  Filled 2018-03-30 (×2): qty 1

## 2018-03-30 MED ORDER — CEFAZOLIN SODIUM-DEXTROSE 2-4 GM/100ML-% IV SOLN
INTRAVENOUS | Status: AC
  Filled 2018-03-30: qty 100

## 2018-03-30 MED ORDER — EPHEDRINE 5 MG/ML INJ
INTRAVENOUS | Status: AC
  Filled 2018-03-30: qty 10

## 2018-03-30 MED ORDER — HYDROMORPHONE HCL 1 MG/ML IJ SOLN: 0.5000 mg | INTRAMUSCULAR | Status: DC | PRN

## 2018-03-30 MED ORDER — POLYETHYL GLYCOL-PROPYL GLYCOL 0.4-0.3 % OP SOLN: 1.0000 [drp] | Freq: Every day | OPHTHALMIC | Status: DC | PRN

## 2018-03-30 MED ORDER — PROPOFOL 10 MG/ML IV BOLUS
INTRAVENOUS | Status: DC | PRN
  Administered 2018-03-30 (×7): 20 mg via INTRAVENOUS

## 2018-03-30 MED ORDER — BISACODYL 5 MG PO TBEC: 5.0000 mg | DELAYED_RELEASE_TABLET | Freq: Every day | ORAL | Status: DC | PRN

## 2018-03-30 MED ORDER — OXYCODONE-ACETAMINOPHEN 5-325 MG PO TABS: 1.0000 | ORAL_TABLET | ORAL | 0 refills | Status: DC | PRN

## 2018-03-30 MED ORDER — FENTANYL CITRATE (PF) 250 MCG/5ML IJ SOLN: INTRAMUSCULAR | Status: DC | PRN

## 2018-03-30 MED ORDER — MIDAZOLAM HCL 2 MG/2ML IJ SOLN
INTRAMUSCULAR | Status: AC
  Filled 2018-03-30: qty 2

## 2018-03-30 MED ORDER — OXYCODONE HCL 5 MG PO TABS
5.0000 mg | ORAL_TABLET | ORAL | Status: DC | PRN
  Administered 2018-03-30 (×2): 10 mg via ORAL
  Filled 2018-03-30 (×2): qty 2

## 2018-03-30 MED ORDER — DIPHENHYDRAMINE HCL 12.5 MG/5ML PO ELIX: 12.5000 mg | ORAL_SOLUTION | ORAL | Status: DC | PRN

## 2018-03-30 MED ORDER — METHOCARBAMOL 500 MG PO TABS
500.0000 mg | ORAL_TABLET | Freq: Four times a day (QID) | ORAL | Status: DC | PRN
  Administered 2018-03-30 – 2018-03-31 (×2): 500 mg via ORAL
  Filled 2018-03-30 (×2): qty 1

## 2018-03-30 MED ORDER — PROPOFOL 10 MG/ML IV BOLUS
INTRAVENOUS | Status: AC
  Filled 2018-03-30: qty 40

## 2018-03-30 MED ORDER — SODIUM CHLORIDE 0.9% FLUSH
INTRAVENOUS | Status: DC | PRN
  Administered 2018-03-30: 20 mL

## 2018-03-30 MED ORDER — LIDOCAINE 2% (20 MG/ML) 5 ML SYRINGE
INTRAMUSCULAR | Status: DC | PRN
  Administered 2018-03-30: 20 mg via INTRAVENOUS

## 2018-03-30 SURGICAL SUPPLY — 59 items
BIT DRILL 2.8 QUICK RELEASE (BIT) ×1 IMPLANT
BLADE SAW SAG 73X25 THK (BLADE)
BLADE SAW SGTL 73X25 THK (BLADE) IMPLANT
COVER SURGICAL LIGHT HANDLE (MISCELLANEOUS) ×3 IMPLANT
COVER WAND RF STERILE (DRAPES) ×2 IMPLANT
DRAPE C-ARM 42X120 X-RAY (DRAPES) ×3 IMPLANT
DRAPE C-ARMOR (DRAPES) ×3 IMPLANT
DRAPE ORTHO SPLIT 77X108 STRL (DRAPES) ×6
DRAPE SHEET LG 3/4 BI-LAMINATE (DRAPES) ×6 IMPLANT
DRAPE SURG ORHT 6 SPLT 77X108 (DRAPES) ×2 IMPLANT
DRAPE U-SHAPE 47X51 STRL (DRAPES) ×3 IMPLANT
DRILL 2.8 QUICK RELEASE (BIT)
DRSG AQUACEL AG ADV 3.5X10 (GAUZE/BANDAGES/DRESSINGS) ×3 IMPLANT
DRSG AQUACEL AG ADV 3.5X14 (GAUZE/BANDAGES/DRESSINGS) ×2 IMPLANT
ELECT BLADE TIP CTD 4 INCH (ELECTRODE) ×3 IMPLANT
ELECT REM PT RETURN 15FT ADLT (MISCELLANEOUS) ×3 IMPLANT
GAUZE SPONGE 4X4 12PLY STRL (GAUZE/BANDAGES/DRESSINGS) IMPLANT
GAUZE XEROFORM 5X9 LF (GAUZE/BANDAGES/DRESSINGS) IMPLANT
GLOVE BIO SURGEON STRL SZ7.5 (GLOVE) ×3 IMPLANT
GLOVE BIO SURGEON STRL SZ8.5 (GLOVE) ×3 IMPLANT
GLOVE BIOGEL PI IND STRL 8 (GLOVE) ×1 IMPLANT
GLOVE BIOGEL PI IND STRL 9 (GLOVE) ×1 IMPLANT
GLOVE BIOGEL PI INDICATOR 8 (GLOVE) ×2
GLOVE BIOGEL PI INDICATOR 9 (GLOVE) ×2
HEAD FEMORAL 36MM (Hips) ×2 IMPLANT
HOOD PEEL AWAY FLYTE STAYCOOL (MISCELLANEOUS) ×12 IMPLANT
IMMOBILIZER KNEE 20 (SOFTGOODS) ×3
IMMOBILIZER KNEE 20 THIGH 36 (SOFTGOODS) ×1 IMPLANT
INSERT TRIDENT POLY 36MM 0DEG (Insert) ×2 IMPLANT
IV NS IRRIG 3000ML ARTHROMATIC (IV SOLUTION) ×3 IMPLANT
KIT BASIN OR (CUSTOM PROCEDURE TRAY) ×3 IMPLANT
NDL MAYO CATGUT SZ4 TPR NDL (NEEDLE) ×1 IMPLANT
NEEDLE HYPO 22GX1.5 SAFETY (NEEDLE) ×6 IMPLANT
NEEDLE MAYO CATGUT SZ4 (NEEDLE) ×3 IMPLANT
NS IRRIG 1000ML POUR BTL (IV SOLUTION) ×3 IMPLANT
PACK TOTAL JOINT (CUSTOM PROCEDURE TRAY) ×3 IMPLANT
PASSER SUT SWANSON 36MM LOOP (INSTRUMENTS) ×3 IMPLANT
POSITIONER SURGICAL ARM (MISCELLANEOUS) ×3 IMPLANT
SHELL ACETABUL CLUSTER SZ 54 (Shell) ×2 IMPLANT
SPONGE LAP 18X18 RF (DISPOSABLE) ×2 IMPLANT
STAPLER VISISTAT 35W (STAPLE) ×3 IMPLANT
SUT ETHIBOND 2 V 37 (SUTURE) ×6 IMPLANT
SUT VIC AB 0 CT2 27 (SUTURE) ×6 IMPLANT
SUT VIC AB 1 CT1 36 (SUTURE) ×3 IMPLANT
SUT VIC AB 1 CTX 36 (SUTURE) ×3
SUT VIC AB 1 CTX36XBRD ANBCTR (SUTURE) ×1 IMPLANT
SUT VIC AB 2-0 CT1 27 (SUTURE) ×6
SUT VIC AB 2-0 CT1 TAPERPNT 27 (SUTURE) ×2 IMPLANT
SUT VIC AB 3-0 CT1 27 (SUTURE) ×3
SUT VIC AB 3-0 CT1 TAPERPNT 27 (SUTURE) ×1 IMPLANT
SWAB COLLECTION DEVICE MRSA (MISCELLANEOUS) IMPLANT
SWAB CULTURE ESWAB REG 1ML (MISCELLANEOUS) IMPLANT
SYR CONTROL 10ML LL (SYRINGE) ×6 IMPLANT
TOWEL OR 17X26 10 PK STRL BLUE (TOWEL DISPOSABLE) ×3 IMPLANT
TOWEL OR NON WOVEN STRL DISP B (DISPOSABLE) ×3 IMPLANT
TOWER CARTRIDGE SMART MIX (DISPOSABLE) IMPLANT
TRAY FOLEY BAG SILVER LF 16FR (CATHETERS) ×3 IMPLANT
WATER STERILE IRR 1000ML POUR (IV SOLUTION) ×6 IMPLANT
YANKAUER SUCT BULB TIP 10FT TU (MISCELLANEOUS) ×3 IMPLANT

## 2018-03-30 NOTE — Anesthesia Preprocedure Evaluation (Addendum)
Anesthesia Evaluation  Patient identified by MRN, date of birth, ID band Patient awake    Reviewed: Allergy & Precautions, NPO status , Patient's Chart, lab work & pertinent test results  Airway Mallampati: II  TM Distance: <3 FB Neck ROM: Full    Dental  (+) Dental Advisory Given, Edentulous Upper   Pulmonary former smoker,    Pulmonary exam normal breath sounds clear to auscultation       Cardiovascular hypertension, Pt. on medications Normal cardiovascular exam Rhythm:Regular Rate:Normal     Neuro/Psych PSYCHIATRIC DISORDERS Depression negative neurological ROS     GI/Hepatic Neg liver ROS, GERD  ,  Endo/Other  Obesity   Renal/GU negative Renal ROS     Musculoskeletal  (+) Arthritis ,   Abdominal   Peds  Hematology negative hematology ROS (+) Plt 170k   Anesthesia Other Findings Day of surgery medications reviewed with the patient.  Reproductive/Obstetrics                           Anesthesia Physical Anesthesia Plan  ASA: III  Anesthesia Plan: Spinal   Post-op Pain Management:    Induction:   PONV Risk Score and Plan: 1 and Propofol infusion and Treatment may vary due to age or medical condition  Airway Management Planned: Natural Airway and Simple Face Mask  Additional Equipment:   Intra-op Plan:   Post-operative Plan:   Informed Consent: I have reviewed the patients History and Physical, chart, labs and discussed the procedure including the risks, benefits and alternatives for the proposed anesthesia with the patient or authorized representative who has indicated his/her understanding and acceptance.   Dental advisory given  Plan Discussed with: CRNA, Anesthesiologist and Surgeon  Anesthesia Plan Comments:         Anesthesia Quick Evaluation

## 2018-03-30 NOTE — Transfer of Care (Signed)
Immediate Anesthesia Transfer of Care Note  Patient: Roger Saunders  Procedure(s) Performed: RIGHT TOTAL HIP REVISION POSTERIOR APPROACH (Right Hip)  Patient Location: PACU  Anesthesia Type:Spinal  Level of Consciousness: awake, alert  and oriented  Airway & Oxygen Therapy: Patient Spontanous Breathing and Patient connected to face mask oxygen  Post-op Assessment: Report given to RN and Post -op Vital signs reviewed and stable  Post vital signs: Reviewed and stable  Last Vitals:  Vitals Value Taken Time  BP    Temp    Pulse    Resp    SpO2      Last Pain:  Vitals:   03/30/18 0554  TempSrc:   PainSc: 0-No pain      Patients Stated Pain Goal: 4 (11/65/79 0383)  Complications: No apparent anesthesia complications

## 2018-03-30 NOTE — Evaluation (Signed)
Physical Therapy Evaluation Patient Details Name: Roger Saunders MRN: [id number] DOB: 16-Apr-1932 Today's Date: 03/30/2018   History of Present Illness  Revision of R hip hemiarthroplasty to THA. Pt had percutaneous pinning of femoral neck fx on 07/02/16, then hip hemiarthroplasty due to poor healing 07/30/16.   Clinical Impression  Pt is s/p THA resulting in the deficits listed below (see PT Problem List). Pt ambulated 36' with RW with min guard assistance. Reviewed posterior hip precautions and initiated HEP. Good progress expected.  Pt will benefit from skilled PT to increase their independence and safety with mobility to allow discharge to the venue listed below.      Follow Up Recommendations Follow surgeon's recommendation for DC plan and follow-up therapies;Home health PT    Equipment Recommendations  None recommended by PT    Recommendations for Other Services       Precautions / Restrictions Precautions Precautions: Fall;Posterior Hip Precaution Booklet Issued: Yes (comment) Precaution Comments: reviewed posterior precautions with pt, handout given to pt and hung in room Restrictions Weight Bearing Restrictions: No RLE Weight Bearing: Weight bearing as tolerated      Mobility  Bed Mobility Overal bed mobility: Needs Assistance Bed Mobility: Supine to Sit     Supine to sit: Mod assist     General bed mobility comments: assist to raise trunk  Transfers Overall transfer level: Needs assistance Equipment used: Rolling walker (2 wheeled) Transfers: Sit to/from Stand Sit to Stand: Min assist;From elevated surface         General transfer comment: VCs for precautions and hand placement  Ambulation/Gait Ambulation/Gait assistance: Min guard Gait Distance (Feet): 50 Feet Assistive device: Rolling walker (2 wheeled) Gait Pattern/deviations: Step-to pattern;Decreased step length - right;Decreased step length - left     General Gait Details: VCs sequencing, no  loss of balance  Stairs            Wheelchair Mobility    Modified Rankin (Stroke Patients Only)       Balance Overall balance assessment: Modified Independent                                           Pertinent Vitals/Pain Pain Assessment: 0-10 Pain Score: 3  Pain Location: R hip Pain Descriptors / Indicators: Sore Pain Intervention(s): Limited activity within patient's tolerance;Monitored during session;Premedicated before session;Ice applied    Home Living Family/patient expects to be discharged to:: Private residence Living Arrangements: Non-relatives/Friends Available Help at Discharge: Friend(s);Available 24 hours/day Type of Home: House Home Access: Stairs to enter   [employer] of Steps: 2 Home Layout: Two level;Able to live on main level with bedroom/bathroom Home Equipment: Gilford Rile - 2 wheels;Bedside commode      Prior Function Level of Independence: Independent         Comments: 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[employer], and was a Engineer, manufacturing systems)     Journalist, newspaper   Dominant Hand: Right    Extremity/Trunk Assessment   Upper Extremity Assessment Upper Extremity Assessment: Overall WFL for tasks assessed    Lower Extremity Assessment Lower Extremity Assessment: RLE deficits/detail RLE Deficits / Details: hip -3/5 grossly, knee ext -4/5, ankle WNL    Cervical / Trunk Assessment Cervical / Trunk Assessment: Normal  Communication   Communication: No difficulties  Cognition Arousal/Alertness: Awake/alert Behavior During Therapy: WFL for tasks assessed/performed Overall Cognitive Status: Within  Functional Limits for tasks assessed                                        General Comments      Exercises Total Joint Exercises Ankle Circles/Pumps: AROM;Both;10 reps;Supine Quad Sets: AROM;Both;5 reps;Supine Heel Slides: AAROM;Right;10 reps;Supine Hip ABduction/ADduction:  AAROM;Right;Supine;10 reps Long Arc Quad: AROM;Right;5 reps;Seated   Assessment/Plan    PT Assessment Patient needs continued PT services  PT Problem List Decreased activity tolerance;Decreased mobility;Pain       PT Treatment Interventions DME instruction;Gait training;Stair training;Therapeutic exercise;Therapeutic activities;Functional mobility training;Patient/family education    PT Goals (Current goals can be found in the Care Plan section)  Acute Rehab PT Goals Patient Stated Goal: to do yardwork PT Goal Formulation: With patient Time For Goal Achievement: 04/06/18 Potential to Achieve Goals: Good    Frequency 7X/week   Barriers to discharge        Co-evaluation               AM-PAC PT "6 Clicks" Daily Activity  Outcome Measure Difficulty turning over in bed (including adjusting bedclothes, sheets and blankets)?: Unable Difficulty moving from lying on back to sitting on the side of the bed? : Unable Difficulty sitting down on and standing up from a chair with arms (e.g., wheelchair, bedside commode, etc,.)?: Unable Help needed moving to and from a bed to chair (including a wheelchair)?: A Little Help needed walking in hospital room?: A Little Help needed climbing 3-5 steps with a railing? : A Lot 6 Click Score: 11    End of Session Equipment Utilized During Treatment: Gait belt Activity Tolerance: Patient tolerated treatment well Patient left: in chair;with call bell/phone within reach Nurse Communication: Mobility status PT Visit Diagnosis: Difficulty in walking, not elsewhere classified (R26.2);Pain Pain - Right/Left: Right Pain - part of body: Hip    Time: 2122-4825 PT Time Calculation (min) (ACUTE ONLY): 30 min   Charges:   PT Evaluation $PT Eval Low Complexity: 1 Low PT Treatments $Gait Training: 8-22 mins       Blondell Reveal Kistler PT 03/30/2018  Acute Rehabilitation Services Pager [phone] Office [phone]

## 2018-03-30 NOTE — Discharge Instructions (Signed)

## 2018-03-30 NOTE — Anesthesia Procedure Notes (Signed)
Date/Time: 03/30/2018 8:05 AM Performed by: Cynda Familia, CRNA Airway Equipment and Method: Oral airway

## 2018-03-30 NOTE — Anesthesia Procedure Notes (Signed)
Spinal  Patient location during procedure: OR End time: 03/30/2018 7:36 AM Staffing Resident/CRNA: Cynda Familia, CRNA Performed: resident/CRNA  Preanesthetic Checklist Completed: patient identified, site marked, surgical consent, pre-op evaluation, timeout performed, IV checked, risks and benefits discussed and monitors and equipment checked Spinal Block Patient position: sitting Prep: site prepped and draped and DuraPrep Patient monitoring: heart rate, continuous pulse ox, blood pressure and cardiac monitor Approach: midline Location: L3-4 Injection technique: single-shot Needle Needle type: Sprotte (as per Gifford Shave)  Needle gauge: 24 G Assessment Sensory level: T4 Additional Notes Expiration date of tray noted and within date.   Patient tolerated procedure well. Prep dry at time of injection, Gifford Shave present assisted and supervised procedure

## 2018-03-30 NOTE — Op Note (Signed)
PATIENT ID:      Roger Saunders  MRN:     [id number] DOB/AGE:    06-19-1931 / 84 y.o.       OPERATIVE REPORT    DATE OF PROCEDURE:  03/30/2018       PREOPERATIVE DIAGNOSIS:  PAINFUL RIGHT TOTAL HIP ARTHROPLASTY                                                       Estimated body mass index is 30.11 kg/m as calculated from the following:   Height as of this encounter: 5\' 8"  (1.727 m).   Weight as of this encounter: 89.8 kg.     POSTOPERATIVE DIAGNOSIS:  painful right total hip arthroplasty                                                           PROCEDURE: Revision total hip arthroplasty with removal of a 53 mm monopolar head, Stryker.  We implanted a 54 mm Trident to shell +0 polyethylene liner to accept a +0 36 mm metal head   SURGEON: Kerin Salen    ASSISTANT:   Kerry Hough. [employer]  (present throughout entire procedure and necessary for timely completion of the procedure)  ANESTHESIA: Spinal BLOOD LOSS: 300 cc FLUID REPLACEMENT: 1500 cc crystalloid Tranexamic Acid: 1 g IV, 2 g topical DRAINS: None COMPLICATIONS: None    INDICATIONS FOR PROCEDURE: Patient sustained a right femoral neck fracture 20 months ago and underwent hip arthroplasty using ingrowth stem and monopolar head from Stryker.  He is a very active 84 year old and has had pain playing tennis ever since the procedure and desires elective revision of the acetabular component to a total hip.  Inflammatory markers were negative.  Aspiration showed no evidence of infection and did provide 1 day of good temporary relief with local anesthetic.  Risks and benefits of surgery been discussed at length.  Patient is prepared for surgical intervention.    PROCEDURE IN DETAIL: The patient was identified by armband,  received preoperative IV antibiotics in the holding area at [facility], taken to the operating room , appropriate anesthetic monitors  were attached and general endotracheal anesthesia induced. Foley catheter  was inserted. Patient was rolled into the left lateral decubitus position and fixed there with a Stulberg Mark II pelvic clamp and the right lower extremity was then prepped and draped  in the usual sterile fashion from the ankle to the hemipelvis. A time-out  procedure was performed.  The skin along the old posterior lateral incision was then infiltrated with a mixture of Exparel and Marcaine.  We re-created the old posterior lateral approach of the hip about 16 cm in length through the skin and subcutaneous tissue down to the level of the IT band which was cut in line with the skin incision exposing the greater trochanter and the posterior hip capsule which was actually well-healed.  The scar tissue was then peeled off the intertrochanteric crest exposing trunnion and monopolar head.  We dissected soft tissues from around the rim of the acetabulum including excising the labrum.  There was then flexed and  internally rotated dislocating the monopolar head which was then removed with a metal cylinder and a mallet.  We further dissected tissue superiorly and anteriorly allowing [place] to place the trunnion superior and anterior to the acetabular rim and holding it there without a Hohmann retractor.  We then further dissected scar tissue from around the acetabulum placed and inferior spike Cobra and a posterior inferior large wing retractor.  We then sequentially reamed up to a 54 mm basket reamer obtaining good cut into the subchondral bone which was actually very good quality.  Bypass tab was then irrigated out with normal saline solution a TXA's soaked sponge was placed in the acetabulum for 1 minute we then infiltrated Exparel into the soft tissues and placed in the acetabulum itself.  We selected a Stryker Trident to 54 mm shell with dome screw holes and hammered it into place and 45 degrees of abduction and 25 degrees of anteversion consistent with the old acetabular rim.  A +0 trial liner to accept a +0 36 mm head  was placed a +0 36 mm trial was hammered onto the trunnion of the hip reduced and stability was noted to flexion of 90 with internal rotation of 70 degrees the hip could not be dislocated in extension and external rotation.  At this point the trial components were removed.  A real +0 E series polyethylene liner to accept a 36 mm head was hammered into the acetabular shell a real +0 x 36 mm metal head was hammered onto the trunnion and the hip again reduced.  Stability was checked one more time and found to be excellent.  The pseudocapsule was repaired back to the intertrochanteric crest through drill holes with #1 Ethibond suture.  The wound was thoroughly irrigated out with normal saline solution.The IT band was closed with running 1 Vicryl suture. The subcutaneous tissue with 0 and 2-0 undyed Vicryl suture and the skin with running  3-0 Vivryl SQ suture. Aquacil dessing was applied. The patient was then unclamped, rolled supine, awaken extubated and taken to recovery room without difficulty in stable condition.   Kerin Salen 03/30/2018, 9:09 AM

## 2018-03-30 NOTE — Interval H&P Note (Signed)
History and Physical Interval Note:  03/30/2018 6:58 AM  Roger Saunders  has presented today for surgery, with the diagnosis of PAINFUL RIGHT TOTAL HIP ARTHROPLASTY  The various methods of treatment have been discussed with the patient and family. After consideration of risks, benefits and other options for treatment, the patient has consented to  Procedure(s): [place] (Right) as a surgical intervention .  The patient's history has been reviewed, patient examined, no change in status, stable for surgery.  I have reviewed the patient's chart and labs.  Questions were answered to the patient's satisfaction.     Kerin Salen

## 2018-03-30 NOTE — Anesthesia Procedure Notes (Signed)
Procedure Name: MAC Date/Time: 03/30/2018 7:25 AM Performed by: Cynda Familia, CRNA Pre-anesthesia Checklist: Patient identified, Emergency Drugs available, Suction available, Patient being monitored and Timeout performed Patient Re-evaluated:Patient Re-evaluated prior to induction Oxygen Delivery Method: Simple face mask Placement Confirmation: positive ETCO2 and breath sounds checked- equal and bilateral Dental Injury: Teeth and Oropharynx as per pre-operative assessment  Comments: Sedation/O2 for spinal

## 2018-03-31 ENCOUNTER — Encounter (HOSPITAL_COMMUNITY): Payer: Self-pay | Admitting: Orthopedic Surgery

## 2018-03-31 LAB — CBC
HEMATOCRIT: 36.1 % — AB (ref 39.0–52.0)
Hemoglobin: 11.4 g/dL — ABNORMAL LOW (ref 13.0–17.0)
MCH: 32.9 pg (ref 26.0–34.0)
MCHC: 31.6 g/dL (ref 30.0–36.0)
MCV: 104.3 fL — AB (ref 80.0–100.0)
NRBC: 0 % (ref 0.0–0.2)
PLATELETS: 115 10*3/uL — AB (ref 150–400)
RBC: 3.46 MIL/uL — ABNORMAL LOW (ref 4.22–5.81)
RDW: 12 % (ref 11.5–15.5)
WBC: 7.2 10*3/uL (ref 4.0–10.5)

## 2018-03-31 LAB — BASIC METABOLIC PANEL
ANION GAP: 5 (ref 5–15)
BUN: 13 mg/dL (ref 8–23)
CALCIUM: 8.2 mg/dL — AB (ref 8.9–10.3)
CO2: 25 mmol/L (ref 22–32)
Chloride: 107 mmol/L (ref 98–111)
Creatinine, Ser: 0.9 mg/dL (ref 0.61–1.24)
GFR calc Af Amer: >60 mL/min (ref >60)
GLUCOSE: 140 mg/dL — AB (ref 70–99)
Potassium: 4.2 mmol/L (ref 3.5–5.1)
Sodium: 137 mmol/L (ref 135–145)

## 2018-03-31 NOTE — Progress Notes (Signed)
Discharge paperwork discussed with pt and family at the bedside.  They demonstrated understanding, and pt was escorted by wheelchair to main lobby in stable condition.

## 2018-03-31 NOTE — Anesthesia Postprocedure Evaluation (Signed)
Anesthesia Post Note  Patient: Roger Saunders  Procedure(s) Performed: RIGHT TOTAL HIP REVISION POSTERIOR APPROACH (Right Hip)     Patient location during evaluation: PACU Anesthesia Type: Spinal Level of consciousness: oriented, awake and alert and awake Pain management: pain level controlled Vital Signs Assessment: post-procedure vital signs reviewed and stable Respiratory status: spontaneous breathing, respiratory function stable and nonlabored ventilation Cardiovascular status: blood pressure returned to baseline and stable Postop Assessment: no headache, no backache, no apparent nausea or vomiting, patient able to bend at knees and spinal receding Anesthetic complications: no    Last Vitals:  Vitals:   03/31/18 0505 03/31/18 0946  BP: (!) 102/50 (!) 119/55  Pulse: (!) 55 (!) 58  Resp: 17 16  Temp: 36.8 C 36.8 C  SpO2: 96% 97%    Last Pain:  Vitals:   03/31/18 0946  TempSrc: Oral  PainSc:                  Catalina Gravel

## 2018-03-31 NOTE — Progress Notes (Signed)
Physical Therapy Treatment Patient Details Name: Roger Saunders MRN: [id number] DOB: [birth year] Today's Date: 03/31/2018    History of Present Illness Revision of R hip hemiarthroplasty to THA. Pt had percutaneous pinning of femoral neck fx on 07/02/16, then hip hemiarthroplasty due to poor healing 07/30/16.     PT Comments    POD # 1 pm session Assisted with amb a second time and performed standing TE's following HEP handout.  Re educated on THP esp with car transfer.  Pt ready for D/C to home.   Follow Up Recommendations  Follow surgeon's recommendation for DC plan and follow-up therapies;Home health PT     Equipment Recommendations  None recommended by PT    Recommendations for Other Services       Precautions / Restrictions Precautions Precautions: Fall;Posterior Hip Precaution Comments: reviewed posterior precautions with pt, handout given to pt and hung in room Restrictions Weight Bearing Restrictions: No RLE Weight Bearing: Weight bearing as tolerated    Mobility  Bed Mobility               General bed mobility comments: OOB in recliner   Transfers Overall transfer level: Needs assistance Equipment used: Rolling walker (2 wheeled) Transfers: Sit to/from Stand Sit to Stand: Supervision;Min guard         General transfer comment: one VC safety with turns  Ambulation/Gait Ambulation/Gait assistance: Supervision;Min guard Gait Distance (Feet): 110 Feet Assistive device: Rolling walker (2 wheeled) Gait Pattern/deviations: Step-to pattern;Decreased step length - right;Decreased step length - left Gait velocity: decreased   General Gait Details: VCs sequencing, no loss of balance   Stairs Stairs: Yes Stairs assistance: Min guard Stair Management: One rail Left Number of Stairs: 2 General stair comments: 25% VC's on proper tech and safety   Wheelchair Mobility    Modified Rankin (Stroke Patients Only)       Balance                                            Cognition Arousal/Alertness: Awake/alert Behavior During Therapy: WFL for tasks assessed/performed Overall Cognitive Status: Within Functional Limits for tasks assessed                                        Exercises   10 reps all standing TE's  Followed by ICE   General Comments        Pertinent Vitals/Pain Pain Assessment: 0-10 Pain Score: 2  Pain Location: R hip Pain Descriptors / Indicators: Sore;Operative site guarding Pain Intervention(s): Monitored during session;Repositioned;Ice applied;Premedicated before session    Home Living                      Prior Function            PT Goals (current goals can now be found in the care plan section) Progress towards PT goals: Progressing toward goals    Frequency    7X/week      PT Plan Current plan remains appropriate    Co-evaluation              AM-PAC PT "6 Clicks" Daily Activity  Outcome Measure    Difficulty moving from lying on back to sitting on the side of the bed? : A Little Difficulty  sitting down on and standing up from a chair with arms (e.g., wheelchair, bedside commode, etc,.)?: A Little Help needed moving to and from a bed to chair (including a wheelchair)?: A Little Help needed walking in hospital room?: A Little Help needed climbing 3-5 steps with a railing? : A Little 6 Click Score: 15    End of Session Equipment Utilized During Treatment: Gait belt Activity Tolerance: Patient tolerated treatment well Patient left: in chair;with call bell/phone within reach   PT Visit Diagnosis: Difficulty in walking, not elsewhere classified (R26.2);Pain Pain - Right/Left: Right Pain - part of body: Hip     Time: 1312-1340 PT Time Calculation (min) (ACUTE ONLY): 28 min  Charges:  $Gait Training: 8-22 mins $Therapeutic Exercise: 8-22 mins                     {Emnet Monk  PTA Acute  Rehabilitation Services Pager       [phone] Office      [phone]

## 2018-03-31 NOTE — Progress Notes (Signed)
Physical Therapy Treatment Patient Details Name: Roger Saunders MRN: [id number] DOB: 14-May-1932 Today's Date: 03/31/2018    History of Present Illness Revision of R hip hemiarthroplasty to THA. Pt had percutaneous pinning of femoral neck fx on 07/02/16, then hip hemiarthroplasty due to poor healing 07/30/16.     PT Comments    POD # 1 am session Assisted with amb a greater distance in hallway, practiced stairs then returned to room to perform some THR TE's following HEP.  Also, discussed THP which pt is aware.  Pt will need another PT session prior to D/C to ensure a safe D/C .   Follow Up Recommendations  Follow surgeon's recommendation for DC plan and follow-up therapies;Home health PT     Equipment Recommendations  None recommended by PT    Recommendations for Other Services       Precautions / Restrictions Precautions Precautions: Fall;Posterior Hip Precaution Comments: reviewed posterior precautions with pt, handout given to pt and hung in room Restrictions Weight Bearing Restrictions: No RLE Weight Bearing: Weight bearing as tolerated    Mobility  Bed Mobility               General bed mobility comments: OOB in recliner   Transfers Overall transfer level: Needs assistance Equipment used: Rolling walker (2 wheeled) Transfers: Sit to/from Stand Sit to Stand: Supervision;Min guard         General transfer comment: one VC safety with turns  Ambulation/Gait Ambulation/Gait assistance: Supervision;Min guard Gait Distance (Feet): 75 Feet Assistive device: Rolling walker (2 wheeled) Gait Pattern/deviations: Step-to pattern;Decreased step length - right;Decreased step length - left Gait velocity: decreased   General Gait Details: VCs sequencing, no loss of balance   Stairs Stairs: Yes Stairs assistance: Min guard Stair Management: One rail Left Number of Stairs: 2 General stair comments: 25% VC's on proper tech and safety   Wheelchair Mobility     Modified Rankin (Stroke Patients Only)       Balance                                            Cognition Arousal/Alertness: Awake/alert Behavior During Therapy: WFL for tasks assessed/performed Overall Cognitive Status: Within Functional Limits for tasks assessed                                        Exercises   Total Hip Replacement TE's 10 reps ankle pumps 10 reps knee presses 10 reps heel slides 10 reps SAQ's 10 reps ABD Followed by ICE     General Comments        Pertinent Vitals/Pain Pain Assessment: 0-10 Pain Score: 2  Pain Location: R hip Pain Descriptors / Indicators: Sore;Operative site guarding Pain Intervention(s): Monitored during session;Repositioned;Ice applied;Premedicated before session    Home Living                      Prior Function            PT Goals (current goals can now be found in the care plan section) Progress towards PT goals: Progressing toward goals    Frequency    7X/week      PT Plan Current plan remains appropriate    Co-evaluation  AM-PAC PT "6 Clicks" Daily Activity  Outcome Measure    Difficulty moving from lying on back to sitting on the side of the bed? : A Little Difficulty sitting down on and standing up from a chair with arms (e.g., wheelchair, bedside commode, etc,.)?: A Little Help needed moving to and from a bed to chair (including a wheelchair)?: A Little Help needed walking in hospital room?: A Little Help needed climbing 3-5 steps with a railing? : A Little 6 Click Score: 15    End of Session Equipment Utilized During Treatment: Gait belt Activity Tolerance: Patient tolerated treatment well Patient left: in chair;with call bell/phone within reach   PT Visit Diagnosis: Difficulty in walking, not elsewhere classified (R26.2);Pain Pain - Right/Left: Right Pain - part of body: Hip     Time: 1205-1230 PT Time Calculation (min)  (ACUTE ONLY): 25 min  Charges:  $Gait Training: 8-22 mins $Therapeutic Exercise: 8-22 mins                     Rica Koyanagi  PTA Acute  Rehabilitation Services Pager      [phone] Office      [phone]

## 2018-03-31 NOTE — Discharge Summary (Signed)
Patient ID: Roger Saunders MRN: [id number] DOB/AGE: 1932/02/08 84 y.o.  Admit date: 03/30/2018 Discharge date: 03/31/2018  Admission Diagnoses:  Principal Problem:   Failed total hip arthroplasty [facility]) Active Problems:   Primary osteoarthritis of right hip   Discharge Diagnoses:  Same  Past Medical History:  Diagnosis Date  . Arthritis   . Conjunctivitis, chronic   . Depression    situational  . GERD (gastroesophageal reflux disease)    occasionally  . History of kidney stones   . Hypertension     Surgeries: Procedure(s): RIGHT TOTAL HIP REVISION POSTERIOR APPROACH on 03/30/2018   Consultants:   Discharged Condition: Improved  Hospital Course: Roger Saunders is an 84 y.o. male who was admitted 03/30/2018 for operative treatment ofFailed total hip arthroplasty ([place]). Patient has severe unremitting pain that affects sleep, daily activities, and work/hobbies. After pre-op clearance the patient was taken to the operating room on 03/30/2018 and underwent  Procedure(s): RIGHT TOTAL HIP REVISION POSTERIOR APPROACH.    Patient was given perioperative antibiotics:  Anti-infectives (From admission, onward)   Start     Dose/Rate Route Frequency Ordered Stop   03/30/18 0657  ceFAZolin (ANCEF) 2-4 GM/100ML-% IVPB    Note to Pharmacy:  Octavio Graves   : cabinet override      03/30/18 0657 03/30/18 1203   03/30/18 0600  ceFAZolin (ANCEF) IVPB 2g/100 mL premix     2 g 200 mL/hr over 30 Minutes Intravenous On call to O.R. 03/30/18 0541 03/30/18 7408       Patient was given sequential compression devices, early ambulation, and chemoprophylaxis to prevent DVT.  Patient benefited maximally from hospital stay and there were no complications.    Recent vital signs:  Patient Vitals for the past 24 hrs:  BP Temp Temp src Pulse Resp SpO2  03/31/18 1355 124/61 98.1 F (36.7 C) Oral 74 - 94 %  03/31/18 0946 (!) 119/55 98.3 F (36.8 C) Oral (!) 58 16 97 %  03/31/18 0505 (!)  102/50 98.3 F (36.8 C) Oral (!) 55 17 96 %  03/31/18 0122 (!) 115/47 98.1 F (36.7 C) Oral 62 16 96 %  03/30/18 2213 133/63 98.2 F (36.8 C) Oral 64 17 98 %  03/30/18 1742 113/61 98.4 F (36.9 C) Oral (!) 58 16 99 %     Recent laboratory studies:  Recent Labs    03/31/18 0443  WBC 7.2  HGB 11.4*  HCT 36.1*  PLT 115*  NA 137  K 4.2  CL 107  CO2 25  BUN 13  CREATININE 0.90  GLUCOSE 140*  CALCIUM 8.2*     Discharge Medications:   Allergies as of 03/31/2018      Reactions   Lipitor [atorvastatin Calcium] Other (See Comments)   Legs hurt      Medication List    STOP taking these medications   HYDROcodone-acetaminophen 5-325 MG tablet Commonly known as:  NORCO/VICODIN     TAKE these medications   amLODipine 5 MG tablet Commonly known as:  NORVASC Take 5 mg by mouth every evening.   aspirin EC 81 MG tablet Take 1 tablet (81 mg total) by mouth 2 (two) times daily.   CALCIUM 600 PO Take 600 mg by mouth 2 (two) times daily.   losartan 100 MG tablet Commonly known as:  COZAAR Take 100 mg by mouth every evening.   oxyCODONE-acetaminophen 5-325 MG tablet Commonly known as:  PERCOCET/ROXICET Take 1 tablet by mouth every 4 (four) hours as needed  for severe pain.   SYSTANE 0.4-0.3 % Soln Generic drug:  Polyethyl Glycol-Propyl Glycol Place 1 drop into both eyes daily as needed (for dry eyes).   tiZANidine 2 MG tablet Commonly known as:  ZANAFLEX Take 1 tablet (2 mg total) by mouth every 6 (six) hours as needed.   TUMS ULTRA 1000 400 MG chewable tablet Generic drug:  calcium elemental as carbonate Chew 2,000 mg by mouth daily as needed for heartburn.   Vitamin D3 2000 units Tabs Take 4,000 Units by mouth every evening.            Durable Medical Equipment  (From admission, onward)         Start     Ordered   03/30/18 1119  DME Walker rolling  Once    Question:  Patient needs a walker to treat with the following condition  Answer:  Status post  right hip replacement   03/30/18 1118   03/30/18 1119  DME 3 n 1  Once     03/30/18 1118           Discharge Care Instructions  (From admission, onward)         Start     Ordered   03/31/18 0000  Weight bearing as tolerated     03/31/18 1444          Diagnostic Studies: Dg Chest 2 View  Result Date: 03/25/2018 CLINICAL DATA:  Preop hip surgery on Monday. EXAM: CHEST - 2 VIEW COMPARISON:  11/29/2015 FINDINGS: There is stable bibasilar scarring. There is no focal consolidation. There is no pleural effusion or pneumothorax. The heart and mediastinal contours are unremarkable. The osseous structures are unremarkable. IMPRESSION: No active cardiopulmonary disease. Electronically Signed   By: Kathreen Devoid   On: 03/25/2018 08:35   Dg Hip Port Unilat With Pelvis 1v Right  Result Date: 03/30/2018 CLINICAL DATA:  Post total hip arthroplasty EXAM: DG HIP (WITH OR WITHOUT PELVIS) 1V PORT RIGHT COMPARISON:  Portable exam 1027 hours compared to 07/30/2016 FINDINGS: Revised RIGHT hip prosthesis in expected position. No acute fracture, dislocation or bone destruction. No periprosthetic lucency. LEFT hip joint space preserved. Scattered atherosclerotic calcifications. IMPRESSION: RIGHT hip prosthesis without acute complication. Electronically Signed   By: Lavonia Dana M.D.   On: 03/30/2018 11:00    Disposition: Discharge disposition: 01-Home or Self Care       Discharge Instructions    Call MD / Call 911   Complete by:  As directed    If you experience chest pain or shortness of breath, CALL 911 and be transported to the hospital emergency room.  If you develope a fever above 101 F, pus (white drainage) or increased drainage or redness at the wound, or calf pain, call your surgeon's office.   Constipation Prevention   Complete by:  As directed    Drink plenty of fluids.  Prune juice may be helpful.  You may use a stool softener, such as Colace (over the counter) 100 mg twice a day.  Use  MiraLax (over the counter) for constipation as needed.   Diet - low sodium heart healthy   Complete by:  As directed    Driving restrictions   Complete by:  As directed    No driving for 2 weeks   Increase activity slowly as tolerated   Complete by:  As directed    Patient may shower   Complete by:  As directed    You may shower without a  dressing once there is no drainage.  Do not wash over the wound.  If drainage remains, cover wound with plastic wrap and then shower.   Weight bearing as tolerated   Complete by:  As directed       Follow-up Information    Frederik Pear, MD In 2 weeks.   Specialty:  Orthopedic Surgery Contact information: [place] [address] [phone]        [facility] Follow up.   Specialty:  [place] Why:  physical therapy Contact information: [address] [place] [address] [phone]            Signed: Joanell Rising 03/31/2018, 2:44 PM

## 2018-03-31 NOTE — Plan of Care (Signed)
Pt alert and oriented, resting comfortably this am. Plan of care discussed with pt. RN will monitor.

## 2018-03-31 NOTE — Care Management Note (Signed)
Case Management Note  Patient Details  Name: WENDY MIKLES MRN: [id number] Date of Birth: 1932/03/30  Subjective/Objective:             Discharge planning, spoke with patient and sone at bedside. Chose [facility] for [facility] services, PT to eval and treat.   Action/Plan: Contacted [facility] for referral. Has RW and 3-n-1. [phone]       Expected Discharge Date:                  Expected Discharge Plan:  [place]  In-House Referral:  NA  Discharge planning Services  CM Consult  Post Acute Care Choice:  Home Health Choice offered to:  Patient, Adult Children  DME Arranged:  N/A DME Agency:  NA  HH Arranged:  PT [place] Agency:  [place]  Status of Service:  Completed, signed off  If discussed at [place] of Stay Meetings, dates discussed:    Additional Comments:  Guadalupe Maple, RN 03/31/2018, 11:27 AM

## 2018-03-31 NOTE — Progress Notes (Signed)
PATIENT ID: Roger Saunders  MRN: [id number]  DOB/AGE:  December 13, 1931 / 84 y.o.  1 Day Post-Op Procedure(s) (LRB): RIGHT TOTAL HIP REVISION POSTERIOR APPROACH (Right)    PROGRESS NOTE Subjective: Patient is alert, oriented, no Nausea, no Vomiting, yes passing gas, . Taking PO well. Denies SOB, Chest or Calf Pain. Using Incentive Spirometer, PAS in place. Ambulate 59' Patient reports pain as  2/10  .    Objective: Vital signs in last 24 hours: Vitals:   03/30/18 1742 03/30/18 2213 03/31/18 0122 03/31/18 0505  BP: 113/61 133/63 (Abnormal) 115/47 (Abnormal) 102/50  Pulse: (Abnormal) 58 64 62 (Abnormal) 55  Resp: 16 17 16 17   Temp: 98.4 F (36.9 C) 98.2 F (36.8 C) 98.1 F (36.7 C) 98.3 F (36.8 C)  TempSrc: Oral Oral Oral Oral  SpO2: 99% 98% 96% 96%  Weight:      Height:          Intake/Output from previous day: I/O last 3 completed shifts: In: 1660 [P.O.:840; I.V.:4883; IV Piggyback:300] Out: 3150 [Urine:2850; Blood:300]   Intake/Output this shift: No intake/output data recorded.   LABORATORY DATA: Recent Labs    03/31/18 0443  WBC 7.2  HGB 11.4*  HCT 36.1*  PLT 115*  NA 137  K 4.2  CL 107  CO2 25  BUN 13  CREATININE 0.90  GLUCOSE 140*  CALCIUM 8.2*    Examination: Neurologically intact ABD soft Neurovascular intact Sensation intact distally Intact pulses distally Dorsiflexion/Plantar flexion intact Incision: dressing C/D/I No cellulitis present Compartment soft} XR AP&Lat of hip shows well placed\fixed THA  Assessment:   1 Day Post-Op Procedure(s) (LRB): RIGHT TOTAL HIP REVISION POSTERIOR APPROACH (Right) ADDITIONAL DIAGNOSIS:  Expected Acute Blood Loss Anemia, Hypertension  Plan: PT/OT WBAT, THA  DVT Prophylaxis: SCDx72 hrs, ASA 81 mg BID x 2 weeks  DISCHARGE PLAN: Home, when he passes physical therapy possibly today  DISCHARGE NEEDS: HHPT, Walker and 3-in-1 comode seat

## 2018-04-01 DIAGNOSIS — Z471 Aftercare following joint replacement surgery: Secondary | ICD-10-CM | POA: Diagnosis not present

## 2018-04-01 DIAGNOSIS — K219 Gastro-esophageal reflux disease without esophagitis: Secondary | ICD-10-CM | POA: Diagnosis not present

## 2018-04-01 DIAGNOSIS — T84090D Other mechanical complication of internal right hip prosthesis, subsequent encounter: Secondary | ICD-10-CM | POA: Diagnosis not present

## 2018-04-01 DIAGNOSIS — Z9181 History of falling: Secondary | ICD-10-CM | POA: Diagnosis not present

## 2018-04-01 DIAGNOSIS — I10 Essential (primary) hypertension: Secondary | ICD-10-CM | POA: Diagnosis not present

## 2018-04-01 DIAGNOSIS — M1991 Primary osteoarthritis, unspecified site: Secondary | ICD-10-CM | POA: Diagnosis not present

## 2018-04-01 DIAGNOSIS — Z96641 Presence of right artificial hip joint: Secondary | ICD-10-CM | POA: Diagnosis not present

## 2018-04-01 DIAGNOSIS — Z87891 Personal history of nicotine dependence: Secondary | ICD-10-CM | POA: Diagnosis not present

## 2018-04-01 DIAGNOSIS — R69 Illness, unspecified: Secondary | ICD-10-CM | POA: Diagnosis not present

## 2018-04-01 DIAGNOSIS — Z7982 Long term (current) use of aspirin: Secondary | ICD-10-CM | POA: Diagnosis not present

## 2018-04-07 DIAGNOSIS — M25551 Pain in right hip: Secondary | ICD-10-CM | POA: Diagnosis not present

## 2018-04-07 DIAGNOSIS — Z96641 Presence of right artificial hip joint: Secondary | ICD-10-CM | POA: Diagnosis not present

## 2018-04-07 DIAGNOSIS — T8484XA Pain due to internal orthopedic prosthetic devices, implants and grafts, initial encounter: Secondary | ICD-10-CM | POA: Diagnosis not present

## 2018-04-14 DIAGNOSIS — Z96641 Presence of right artificial hip joint: Secondary | ICD-10-CM | POA: Diagnosis not present

## 2018-04-14 DIAGNOSIS — Z471 Aftercare following joint replacement surgery: Secondary | ICD-10-CM | POA: Diagnosis not present

## 2018-04-14 DIAGNOSIS — M25551 Pain in right hip: Secondary | ICD-10-CM | POA: Diagnosis not present

## 2018-04-28 DIAGNOSIS — R739 Hyperglycemia, unspecified: Secondary | ICD-10-CM | POA: Diagnosis not present

## 2018-04-28 DIAGNOSIS — I10 Essential (primary) hypertension: Secondary | ICD-10-CM | POA: Diagnosis not present

## 2018-05-05 DIAGNOSIS — I10 Essential (primary) hypertension: Secondary | ICD-10-CM | POA: Diagnosis not present

## 2018-05-05 DIAGNOSIS — Z0000 Encounter for general adult medical examination without abnormal findings: Secondary | ICD-10-CM | POA: Diagnosis not present

## 2018-05-05 DIAGNOSIS — R1310 Dysphagia, unspecified: Secondary | ICD-10-CM | POA: Diagnosis not present

## 2018-05-05 DIAGNOSIS — R69 Illness, unspecified: Secondary | ICD-10-CM | POA: Diagnosis not present

## 2018-05-05 DIAGNOSIS — K219 Gastro-esophageal reflux disease without esophagitis: Secondary | ICD-10-CM | POA: Diagnosis not present

## 2018-05-05 DIAGNOSIS — E7800 Pure hypercholesterolemia, unspecified: Secondary | ICD-10-CM | POA: Diagnosis not present

## 2018-05-26 DIAGNOSIS — M25551 Pain in right hip: Secondary | ICD-10-CM | POA: Diagnosis not present

## 2018-06-13 DIAGNOSIS — R42 Dizziness and giddiness: Secondary | ICD-10-CM | POA: Diagnosis not present

## 2018-07-14 DIAGNOSIS — H5211 Myopia, right eye: Secondary | ICD-10-CM | POA: Diagnosis not present

## 2018-07-14 DIAGNOSIS — H02831 Dermatochalasis of right upper eyelid: Secondary | ICD-10-CM | POA: Diagnosis not present

## 2018-07-14 DIAGNOSIS — H25813 Combined forms of age-related cataract, bilateral: Secondary | ICD-10-CM | POA: Diagnosis not present

## 2018-07-14 DIAGNOSIS — H5212 Myopia, left eye: Secondary | ICD-10-CM | POA: Diagnosis not present

## 2018-07-14 DIAGNOSIS — H02832 Dermatochalasis of right lower eyelid: Secondary | ICD-10-CM | POA: Diagnosis not present

## 2018-07-14 DIAGNOSIS — H02834 Dermatochalasis of left upper eyelid: Secondary | ICD-10-CM | POA: Diagnosis not present

## 2018-07-28 DIAGNOSIS — J029 Acute pharyngitis, unspecified: Secondary | ICD-10-CM | POA: Diagnosis not present

## 2018-08-13 IMAGING — CT CT HEAD W/O CM
3 series · 15 of 47 positions shown, 18 images · non-contrast
Comparison: 07/10/2017 neck CT and 11/03/2008 brain MR

CLINICAL DATA: 85-year-old male with headache for 5 weeks.

EXAM:
CT HEAD WITHOUT CONTRAST
TECHNIQUE: Contiguous axial images were obtained from the base of the skull
through the vertex without intravenous contrast.

[Series 2: head wo · axial · 0.48mm/px · z∈[+1607,+1747]mm · 9 of 34 slices shown, 12 images]
[im 3/34  brain]
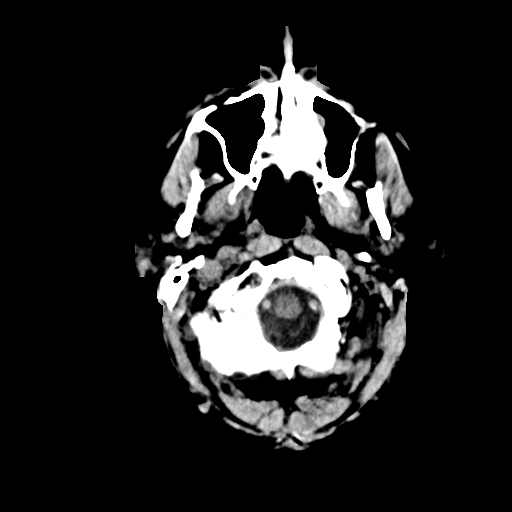
[im 3/34  bone]
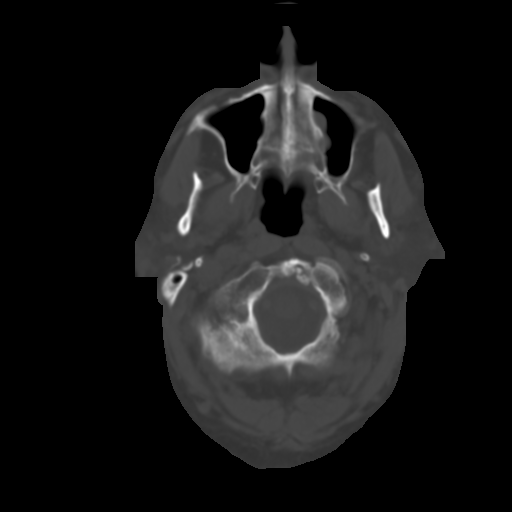
[im 6/34  brain]
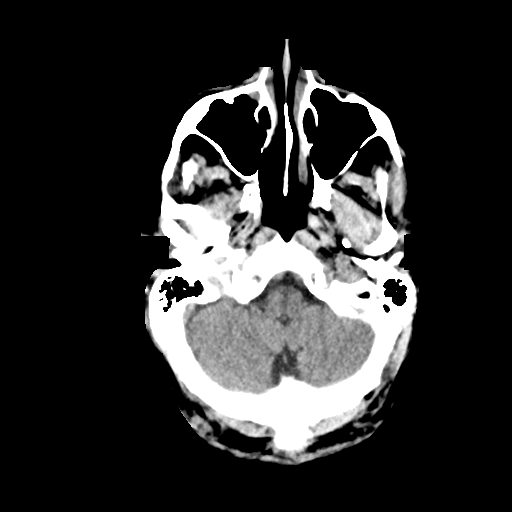
[im 10/34  brain]
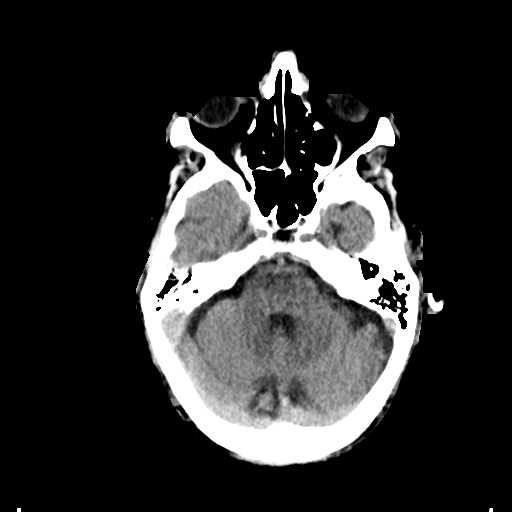
[im 13/34  brain]
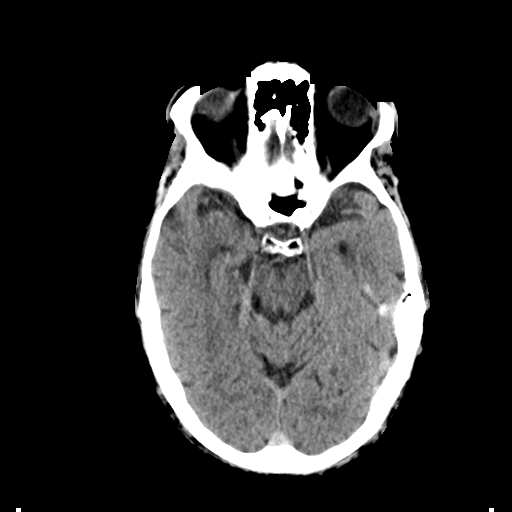
[im 18/34  brain]
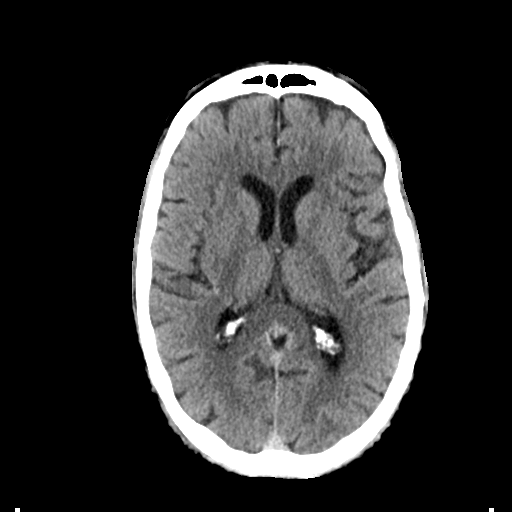
[im 18/34  bone]
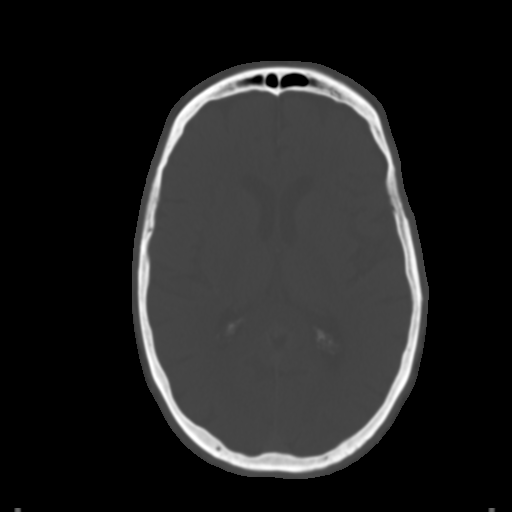
[im 21/34  brain]
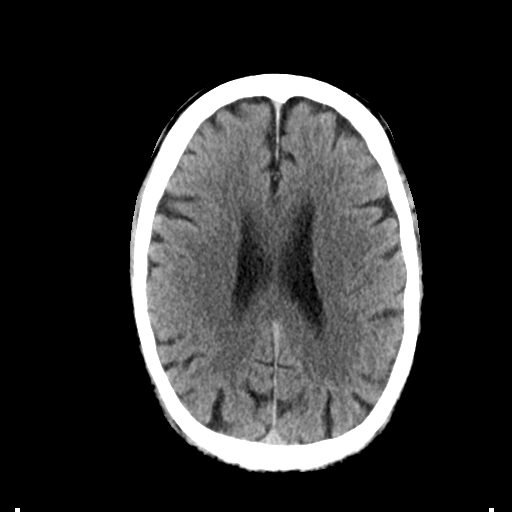
[im 24/34  brain]
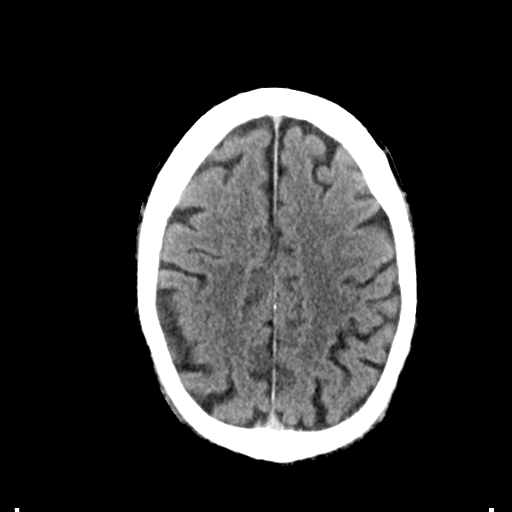
[im 28/34  brain]
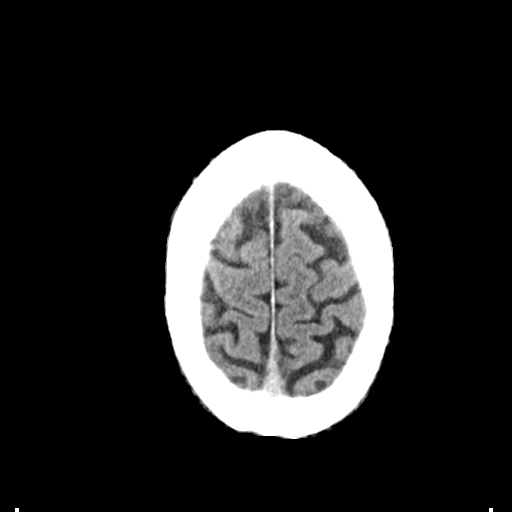
[im 31/34  brain]
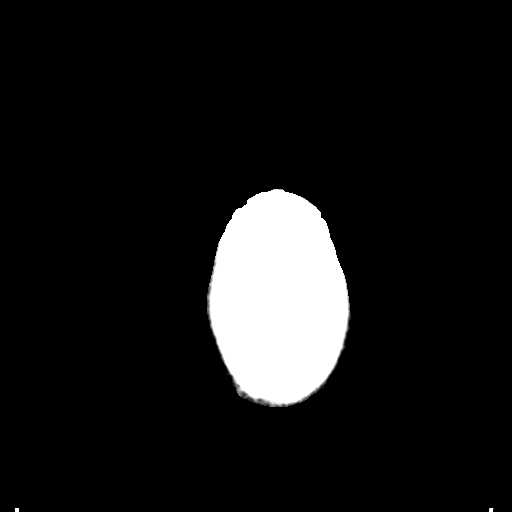
[im 31/34  bone]
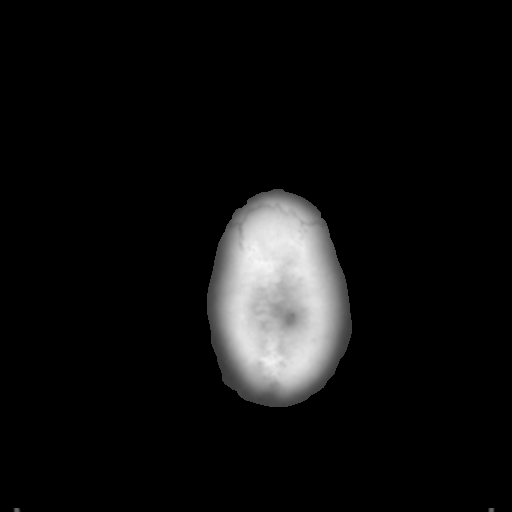

[Series 4: coronal soft tissue · coronal · 0.34mm/px · 3 of 79 slices shown]
[im 27/79  brain]
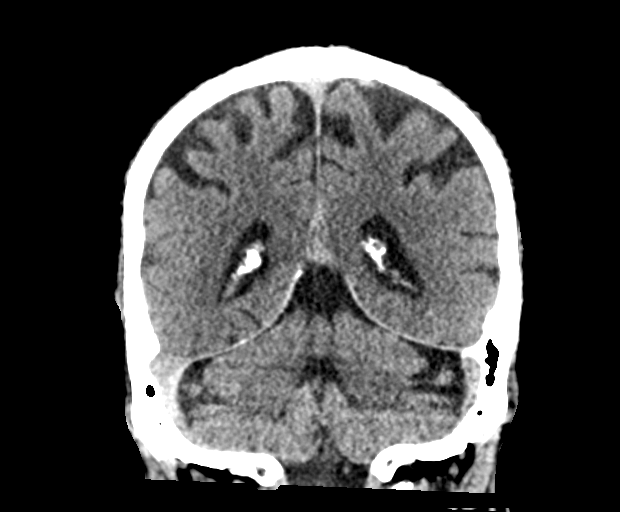
[im 35/79  brain]
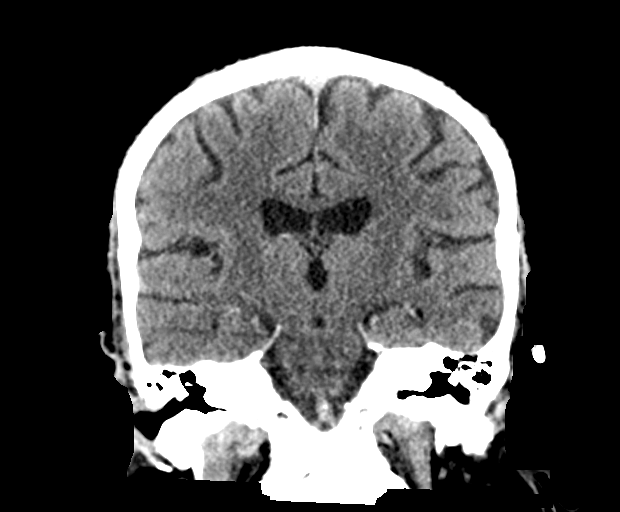
[im 44/79  brain]
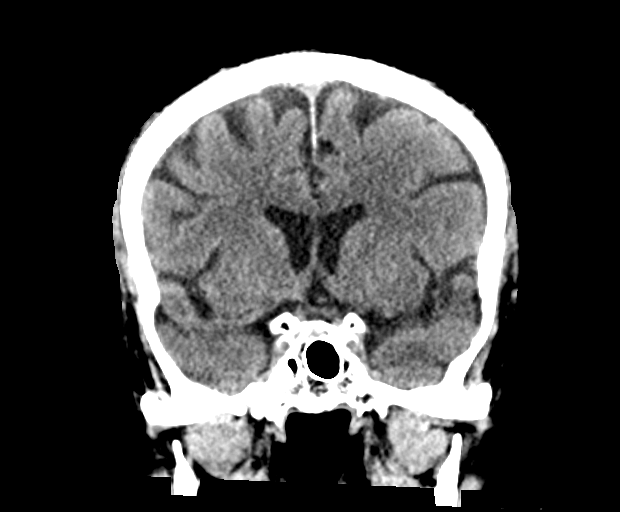

[Series 5: sagittal soft tissue · sagittal · 0.36mm/px · 3 of 67 slices shown]
[im 23/67  brain]
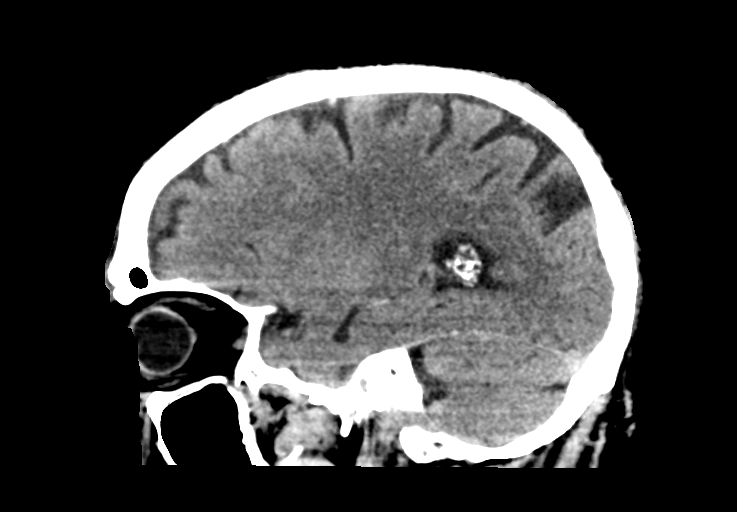
[im 34/67  brain]
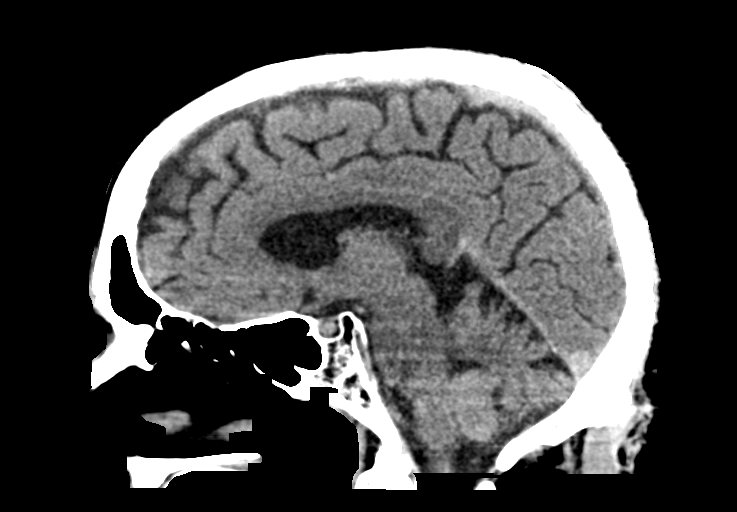
[im 45/67  brain]
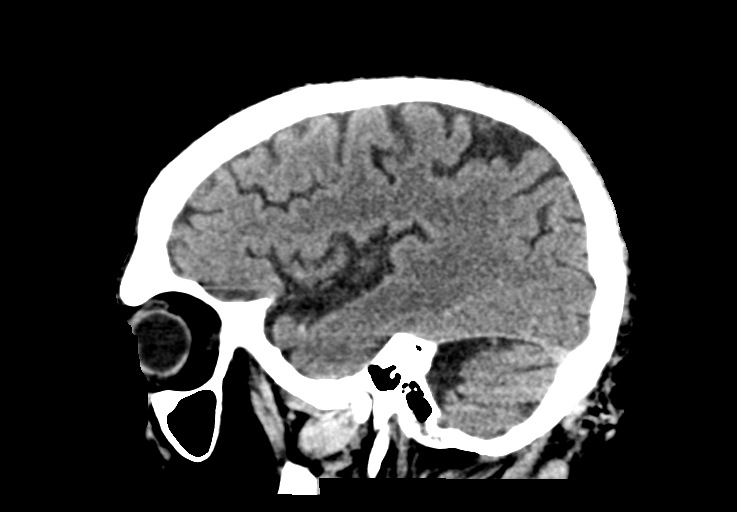

[15 of 47 positions shown; findings below may reference images not displayed]

FINDINGS: Brain: No evidence of acute infarction, hemorrhage, hydrocephalus,
extra-axial collection or mass lesion/mass effect.

Mild atrophy and mild chronic small-vessel white matter ischemic
changes again noted.

Vascular: No hyperdense vessel or unexpected calcification.

Skull: Normal. Negative for fracture or focal lesion.

Sinuses/Orbits: No acute finding.

Other: None.
IMPRESSION: 1. No evidence of acute intracranial abnormality
2. Mild atrophy and mild chronic small-vessel white matter ischemic
changes.

## 2018-08-17 DIAGNOSIS — H02832 Dermatochalasis of right lower eyelid: Secondary | ICD-10-CM | POA: Diagnosis not present

## 2018-08-17 DIAGNOSIS — H02834 Dermatochalasis of left upper eyelid: Secondary | ICD-10-CM | POA: Diagnosis not present

## 2018-08-17 DIAGNOSIS — H02831 Dermatochalasis of right upper eyelid: Secondary | ICD-10-CM | POA: Diagnosis not present

## 2018-08-17 DIAGNOSIS — H25813 Combined forms of age-related cataract, bilateral: Secondary | ICD-10-CM | POA: Diagnosis not present

## 2018-09-29 DIAGNOSIS — M25551 Pain in right hip: Secondary | ICD-10-CM | POA: Diagnosis not present

## 2018-10-01 DIAGNOSIS — T63301A Toxic effect of unspecified spider venom, accidental (unintentional), initial encounter: Secondary | ICD-10-CM | POA: Diagnosis not present

## 2018-10-21 DIAGNOSIS — R69 Illness, unspecified: Secondary | ICD-10-CM | POA: Diagnosis not present

## 2018-10-28 DIAGNOSIS — E7800 Pure hypercholesterolemia, unspecified: Secondary | ICD-10-CM | POA: Diagnosis not present

## 2018-10-28 DIAGNOSIS — I10 Essential (primary) hypertension: Secondary | ICD-10-CM | POA: Diagnosis not present

## 2018-10-28 DIAGNOSIS — R739 Hyperglycemia, unspecified: Secondary | ICD-10-CM | POA: Diagnosis not present

## 2018-11-04 DIAGNOSIS — R350 Frequency of micturition: Secondary | ICD-10-CM | POA: Diagnosis not present

## 2018-11-04 DIAGNOSIS — R69 Illness, unspecified: Secondary | ICD-10-CM | POA: Diagnosis not present

## 2018-11-04 DIAGNOSIS — E7800 Pure hypercholesterolemia, unspecified: Secondary | ICD-10-CM | POA: Diagnosis not present

## 2018-11-04 DIAGNOSIS — K219 Gastro-esophageal reflux disease without esophagitis: Secondary | ICD-10-CM | POA: Diagnosis not present

## 2018-11-04 DIAGNOSIS — R739 Hyperglycemia, unspecified: Secondary | ICD-10-CM | POA: Diagnosis not present

## 2018-11-04 DIAGNOSIS — I10 Essential (primary) hypertension: Secondary | ICD-10-CM | POA: Diagnosis not present

## 2018-11-16 DIAGNOSIS — H2512 Age-related nuclear cataract, left eye: Secondary | ICD-10-CM | POA: Diagnosis not present

## 2018-11-17 DIAGNOSIS — M25551 Pain in right hip: Secondary | ICD-10-CM | POA: Diagnosis not present

## 2018-11-17 DIAGNOSIS — M1711 Unilateral primary osteoarthritis, right knee: Secondary | ICD-10-CM | POA: Diagnosis not present

## 2018-11-18 DIAGNOSIS — M79604 Pain in right leg: Secondary | ICD-10-CM | POA: Diagnosis not present

## 2018-11-18 DIAGNOSIS — M545 Low back pain: Secondary | ICD-10-CM | POA: Diagnosis not present

## 2018-11-18 DIAGNOSIS — M5416 Radiculopathy, lumbar region: Secondary | ICD-10-CM | POA: Diagnosis not present

## 2018-11-18 DIAGNOSIS — S39012D Strain of muscle, fascia and tendon of lower back, subsequent encounter: Secondary | ICD-10-CM | POA: Diagnosis not present

## 2018-11-23 DIAGNOSIS — M5416 Radiculopathy, lumbar region: Secondary | ICD-10-CM | POA: Diagnosis not present

## 2018-11-23 DIAGNOSIS — M79604 Pain in right leg: Secondary | ICD-10-CM | POA: Diagnosis not present

## 2018-11-23 DIAGNOSIS — M545 Low back pain: Secondary | ICD-10-CM | POA: Diagnosis not present

## 2018-11-24 DIAGNOSIS — H2512 Age-related nuclear cataract, left eye: Secondary | ICD-10-CM | POA: Diagnosis not present

## 2018-11-24 DIAGNOSIS — H25812 Combined forms of age-related cataract, left eye: Secondary | ICD-10-CM | POA: Diagnosis not present

## 2018-11-24 DIAGNOSIS — H268 Other specified cataract: Secondary | ICD-10-CM | POA: Diagnosis not present

## 2018-12-02 DIAGNOSIS — H2511 Age-related nuclear cataract, right eye: Secondary | ICD-10-CM | POA: Diagnosis not present

## 2018-12-07 DIAGNOSIS — H60331 Swimmer's ear, right ear: Secondary | ICD-10-CM | POA: Diagnosis not present

## 2018-12-07 DIAGNOSIS — H6122 Impacted cerumen, left ear: Secondary | ICD-10-CM | POA: Diagnosis not present

## 2018-12-09 DIAGNOSIS — N200 Calculus of kidney: Secondary | ICD-10-CM | POA: Diagnosis not present

## 2018-12-09 DIAGNOSIS — N401 Enlarged prostate with lower urinary tract symptoms: Secondary | ICD-10-CM | POA: Diagnosis not present

## 2018-12-09 DIAGNOSIS — R351 Nocturia: Secondary | ICD-10-CM | POA: Diagnosis not present

## 2018-12-10 DIAGNOSIS — M545 Low back pain: Secondary | ICD-10-CM | POA: Diagnosis not present

## 2018-12-10 DIAGNOSIS — M5416 Radiculopathy, lumbar region: Secondary | ICD-10-CM | POA: Diagnosis not present

## 2018-12-10 DIAGNOSIS — M79604 Pain in right leg: Secondary | ICD-10-CM | POA: Diagnosis not present

## 2018-12-14 DIAGNOSIS — M79604 Pain in right leg: Secondary | ICD-10-CM | POA: Diagnosis not present

## 2018-12-14 DIAGNOSIS — M545 Low back pain: Secondary | ICD-10-CM | POA: Diagnosis not present

## 2018-12-14 DIAGNOSIS — M5416 Radiculopathy, lumbar region: Secondary | ICD-10-CM | POA: Diagnosis not present

## 2018-12-15 DIAGNOSIS — H25811 Combined forms of age-related cataract, right eye: Secondary | ICD-10-CM | POA: Diagnosis not present

## 2018-12-15 DIAGNOSIS — H268 Other specified cataract: Secondary | ICD-10-CM | POA: Diagnosis not present

## 2018-12-15 DIAGNOSIS — H2511 Age-related nuclear cataract, right eye: Secondary | ICD-10-CM | POA: Diagnosis not present

## 2019-01-04 DIAGNOSIS — L299 Pruritus, unspecified: Secondary | ICD-10-CM | POA: Diagnosis not present

## 2019-01-26 DIAGNOSIS — Z0100 Encounter for examination of eyes and vision without abnormal findings: Secondary | ICD-10-CM | POA: Diagnosis not present

## 2019-02-19 DIAGNOSIS — Z23 Encounter for immunization: Secondary | ICD-10-CM | POA: Diagnosis not present

## 2019-03-12 DIAGNOSIS — R1312 Dysphagia, oropharyngeal phase: Secondary | ICD-10-CM | POA: Diagnosis not present

## 2019-03-15 ENCOUNTER — Other Ambulatory Visit: Payer: Self-pay | Admitting: Otolaryngology

## 2019-03-15 DIAGNOSIS — R1312 Dysphagia, oropharyngeal phase: Secondary | ICD-10-CM

## 2019-03-19 ENCOUNTER — Ambulatory Visit
Admission: RE | Admit: 2019-03-19 | Discharge: 2019-03-19 | Disposition: A | Discharge status: Home / Self Care | Payer: Medicare HMO | Source: Ambulatory Visit | Attending: Otolaryngology | Admitting: Otolaryngology

## 2019-03-19 DIAGNOSIS — R1312 Dysphagia, oropharyngeal phase: Secondary | ICD-10-CM

## 2019-04-02 ENCOUNTER — Encounter (INDEPENDENT_AMBULATORY_CARE_PROVIDER_SITE_OTHER): Payer: Self-pay

## 2019-04-15 DIAGNOSIS — M25551 Pain in right hip: Secondary | ICD-10-CM | POA: Diagnosis not present

## 2019-04-22 DIAGNOSIS — R69 Illness, unspecified: Secondary | ICD-10-CM | POA: Diagnosis not present

## 2019-05-03 DIAGNOSIS — I10 Essential (primary) hypertension: Secondary | ICD-10-CM | POA: Diagnosis not present

## 2019-05-03 DIAGNOSIS — R739 Hyperglycemia, unspecified: Secondary | ICD-10-CM | POA: Diagnosis not present

## 2019-05-03 DIAGNOSIS — Z0000 Encounter for general adult medical examination without abnormal findings: Secondary | ICD-10-CM | POA: Diagnosis not present

## 2019-05-03 DIAGNOSIS — E7800 Pure hypercholesterolemia, unspecified: Secondary | ICD-10-CM | POA: Diagnosis not present

## 2019-05-14 DIAGNOSIS — E7800 Pure hypercholesterolemia, unspecified: Secondary | ICD-10-CM | POA: Diagnosis not present

## 2019-05-14 DIAGNOSIS — Z0000 Encounter for general adult medical examination without abnormal findings: Secondary | ICD-10-CM | POA: Diagnosis not present

## 2019-05-14 DIAGNOSIS — I10 Essential (primary) hypertension: Secondary | ICD-10-CM | POA: Diagnosis not present

## 2019-05-14 DIAGNOSIS — R7303 Prediabetes: Secondary | ICD-10-CM | POA: Diagnosis not present

## 2019-07-05 ENCOUNTER — Ambulatory Visit: Payer: Medicare HMO | Attending: Internal Medicine

## 2019-07-05 DIAGNOSIS — Z23 Encounter for immunization: Secondary | ICD-10-CM | POA: Insufficient documentation

## 2019-07-05 NOTE — Progress Notes (Signed)
   Covid-19 Vaccination Clinic  Name:  Roger Saunders    MRN: [id number] DOB: [birth year]  07/05/2019  Roger Saunders was observed post Covid-19 immunization for 15 minutes without incidence. He was provided with Vaccine Information Sheet and instruction to access the V-Safe system.   Roger Saunders was instructed to call 911 with any severe reactions post vaccine: Marland Kitchen Difficulty breathing  . Swelling of your face and throat  . A fast heartbeat  . A bad rash all over your body  . Dizziness and weakness    Immunizations Administered    Name Date Dose VIS Date Route   Pfizer COVID-19 Vaccine 07/05/2019 12:00 PM 0.3 mL 05/21/2019 Intramuscular   Manufacturer: [place]   Lot: [id number]   [place]: [id number]

## 2019-07-14 DIAGNOSIS — H524 Presbyopia: Secondary | ICD-10-CM | POA: Diagnosis not present

## 2019-07-14 DIAGNOSIS — H52223 Regular astigmatism, bilateral: Secondary | ICD-10-CM | POA: Diagnosis not present

## 2019-07-26 ENCOUNTER — Ambulatory Visit: Payer: Medicare HMO | Attending: Internal Medicine

## 2019-07-26 DIAGNOSIS — Z23 Encounter for immunization: Secondary | ICD-10-CM | POA: Insufficient documentation

## 2019-07-26 NOTE — Progress Notes (Signed)
   Covid-19 Vaccination Clinic  Name:  Roger Saunders    MRN: [id number] DOB: July 14, 1931  07/26/2019  Roger Saunders was observed post Covid-19 immunization for 15 minutes without incidence. He was provided with Vaccine Information Sheet and instruction to access the V-Safe system.   Roger Saunders was instructed to call 911 with any severe reactions post vaccine: Marland Kitchen Difficulty breathing  . Swelling of your face and throat  . A fast heartbeat  . A bad rash all over your body  . Dizziness and weakness    Immunizations Administered    Name Date Dose VIS Date Route   Pfizer COVID-19 Vaccine 07/26/2019 11:13 AM 0.3 mL 05/21/2019 Intramuscular   Manufacturer: [employer]   Lot: [id number]   [place]: [id number]

## 2019-10-28 DIAGNOSIS — M1712 Unilateral primary osteoarthritis, left knee: Secondary | ICD-10-CM | POA: Diagnosis not present

## 2019-10-28 DIAGNOSIS — M1711 Unilateral primary osteoarthritis, right knee: Secondary | ICD-10-CM | POA: Diagnosis not present

## 2019-11-11 DIAGNOSIS — E7800 Pure hypercholesterolemia, unspecified: Secondary | ICD-10-CM | POA: Diagnosis not present

## 2019-11-11 DIAGNOSIS — R7303 Prediabetes: Secondary | ICD-10-CM | POA: Diagnosis not present

## 2019-11-11 DIAGNOSIS — R69 Illness, unspecified: Secondary | ICD-10-CM | POA: Diagnosis not present

## 2019-11-18 DIAGNOSIS — M542 Cervicalgia: Secondary | ICD-10-CM | POA: Diagnosis not present

## 2019-11-18 DIAGNOSIS — I10 Essential (primary) hypertension: Secondary | ICD-10-CM | POA: Diagnosis not present

## 2019-11-18 DIAGNOSIS — R42 Dizziness and giddiness: Secondary | ICD-10-CM | POA: Diagnosis not present

## 2019-11-18 DIAGNOSIS — E7800 Pure hypercholesterolemia, unspecified: Secondary | ICD-10-CM | POA: Diagnosis not present

## 2019-11-18 DIAGNOSIS — R69 Illness, unspecified: Secondary | ICD-10-CM | POA: Diagnosis not present

## 2019-11-18 DIAGNOSIS — R05 Cough: Secondary | ICD-10-CM | POA: Diagnosis not present

## 2019-11-18 DIAGNOSIS — R350 Frequency of micturition: Secondary | ICD-10-CM | POA: Diagnosis not present

## 2019-11-18 DIAGNOSIS — R1310 Dysphagia, unspecified: Secondary | ICD-10-CM | POA: Diagnosis not present

## 2019-11-18 DIAGNOSIS — K219 Gastro-esophageal reflux disease without esophagitis: Secondary | ICD-10-CM | POA: Diagnosis not present

## 2019-12-20 DIAGNOSIS — N401 Enlarged prostate with lower urinary tract symptoms: Secondary | ICD-10-CM | POA: Diagnosis not present

## 2019-12-20 DIAGNOSIS — R351 Nocturia: Secondary | ICD-10-CM | POA: Diagnosis not present

## 2019-12-20 DIAGNOSIS — N200 Calculus of kidney: Secondary | ICD-10-CM | POA: Diagnosis not present

## 2020-03-17 DIAGNOSIS — Z23 Encounter for immunization: Secondary | ICD-10-CM | POA: Diagnosis not present

## 2020-04-18 DIAGNOSIS — Z96641 Presence of right artificial hip joint: Secondary | ICD-10-CM | POA: Diagnosis not present

## 2020-04-18 DIAGNOSIS — M25551 Pain in right hip: Secondary | ICD-10-CM | POA: Diagnosis not present

## 2020-05-18 DIAGNOSIS — R69 Illness, unspecified: Secondary | ICD-10-CM | POA: Diagnosis not present

## 2020-05-23 DIAGNOSIS — R739 Hyperglycemia, unspecified: Secondary | ICD-10-CM | POA: Diagnosis not present

## 2020-05-23 DIAGNOSIS — I10 Essential (primary) hypertension: Secondary | ICD-10-CM | POA: Diagnosis not present

## 2020-05-23 DIAGNOSIS — R350 Frequency of micturition: Secondary | ICD-10-CM | POA: Diagnosis not present

## 2020-05-23 DIAGNOSIS — E559 Vitamin D deficiency, unspecified: Secondary | ICD-10-CM | POA: Diagnosis not present

## 2020-05-23 DIAGNOSIS — E7800 Pure hypercholesterolemia, unspecified: Secondary | ICD-10-CM | POA: Diagnosis not present

## 2020-05-29 DIAGNOSIS — I10 Essential (primary) hypertension: Secondary | ICD-10-CM | POA: Diagnosis not present

## 2020-05-29 DIAGNOSIS — Z0000 Encounter for general adult medical examination without abnormal findings: Secondary | ICD-10-CM | POA: Diagnosis not present

## 2020-05-29 DIAGNOSIS — R1310 Dysphagia, unspecified: Secondary | ICD-10-CM | POA: Diagnosis not present

## 2020-05-29 DIAGNOSIS — R69 Illness, unspecified: Secondary | ICD-10-CM | POA: Diagnosis not present

## 2020-05-29 DIAGNOSIS — E559 Vitamin D deficiency, unspecified: Secondary | ICD-10-CM | POA: Diagnosis not present

## 2020-05-29 DIAGNOSIS — M542 Cervicalgia: Secondary | ICD-10-CM | POA: Diagnosis not present

## 2020-05-29 DIAGNOSIS — R350 Frequency of micturition: Secondary | ICD-10-CM | POA: Diagnosis not present

## 2020-05-29 DIAGNOSIS — K219 Gastro-esophageal reflux disease without esophagitis: Secondary | ICD-10-CM | POA: Diagnosis not present

## 2020-05-29 DIAGNOSIS — E7800 Pure hypercholesterolemia, unspecified: Secondary | ICD-10-CM | POA: Diagnosis not present

## 2020-06-08 DIAGNOSIS — M545 Low back pain, unspecified: Secondary | ICD-10-CM | POA: Diagnosis not present

## 2020-06-15 DIAGNOSIS — S52132D Displaced fracture of neck of left radius, subsequent encounter for closed fracture with routine healing: Secondary | ICD-10-CM | POA: Diagnosis not present

## 2020-06-15 DIAGNOSIS — S52002D Unspecified fracture of upper end of left ulna, subsequent encounter for closed fracture with routine healing: Secondary | ICD-10-CM | POA: Diagnosis not present

## 2020-06-19 DIAGNOSIS — R262 Difficulty in walking, not elsewhere classified: Secondary | ICD-10-CM | POA: Diagnosis not present

## 2020-06-22 DIAGNOSIS — R262 Difficulty in walking, not elsewhere classified: Secondary | ICD-10-CM | POA: Diagnosis not present

## 2020-06-23 DIAGNOSIS — L989 Disorder of the skin and subcutaneous tissue, unspecified: Secondary | ICD-10-CM | POA: Diagnosis not present

## 2020-06-23 DIAGNOSIS — T63301A Toxic effect of unspecified spider venom, accidental (unintentional), initial encounter: Secondary | ICD-10-CM | POA: Diagnosis not present

## 2020-07-03 DIAGNOSIS — R262 Difficulty in walking, not elsewhere classified: Secondary | ICD-10-CM | POA: Diagnosis not present

## 2020-07-17 DIAGNOSIS — I10 Essential (primary) hypertension: Secondary | ICD-10-CM | POA: Diagnosis not present

## 2020-07-19 DIAGNOSIS — H52223 Regular astigmatism, bilateral: Secondary | ICD-10-CM | POA: Diagnosis not present

## 2020-07-19 DIAGNOSIS — H524 Presbyopia: Secondary | ICD-10-CM | POA: Diagnosis not present

## 2020-07-25 DIAGNOSIS — Z0100 Encounter for examination of eyes and vision without abnormal findings: Secondary | ICD-10-CM | POA: Diagnosis not present

## 2020-09-26 ENCOUNTER — Other Ambulatory Visit: Payer: Self-pay | Admitting: Nurse Practitioner

## 2020-09-26 ENCOUNTER — Telehealth (INDEPENDENT_AMBULATORY_CARE_PROVIDER_SITE_OTHER): Payer: Medicare HMO | Admitting: Nurse Practitioner

## 2020-09-26 DIAGNOSIS — U071 COVID-19: Secondary | ICD-10-CM | POA: Diagnosis not present

## 2020-09-26 NOTE — Patient Instructions (Signed)
Covid 19 Cough Sinus congestion:   Stay well hydrated  Stay active  Deep breathing exercises  May take tylenol or fever or pain  Please complete entire course of molnupiravir   Follow up:  Follow up in 2 weeks or sooner if needed

## 2020-09-26 NOTE — Progress Notes (Signed)
Virtual Visit via Telephone Note  I connected with Roger Saunders on 09/26/20 at  8:30 AM EDT by telephone and verified that I am speaking with the correct person using two identifiers.  Location: Patient: home Provider: office   I discussed the limitations, risks, security and privacy concerns of performing an evaluation and management service by telephone and the availability of in person appointments. I also discussed with the patient that there may be a patient responsible charge related to this service. The patient expressed understanding and agreed to proceed.   History of Present Illness:  Patient presents today for post-COVID care clinic visit through televisit.  Patient states that he tested positive for COVID on 09/20/2018.  His symptom onset was 09/17/2020.  Patient has been fully vaccinated with booster.  Patient was tested through [facility] and was ordered oral antiviral therapy for [facility].  Patient states today is his last dose of Molnupiravir.  Patient states that overall he is doing well.  He has been afebrile for 2 days.  He states that he does have slight cough and slight sinus congestion.  He declines chest x-ray at this time.  He states that overall he is feeling well and does not feel that he has pneumonia.  He states that he is improving daily. Denies f/c/s, n/v/d, hemoptysis, PND, chest pain or edema.    Observations/Objective:  Vitals with BMI 03/31/2018 03/31/2018 03/31/2018  Height - - -  Weight - - -  BMI - - -  Systolic [phone]  Diastolic 61 55 50  Pulse 74 58 55      Assessment and Plan:  Covid 19 Cough Sinus congestion:   Stay well hydrated  Stay active  Deep breathing exercises  May take tylenol or fever or pain  Please complete entire course of molnupiravir   Follow up:  Follow up in 2 weeks or sooner if needed      I discussed the assessment and treatment plan with the patient. The patient was provided an opportunity to ask  questions and all were answered. The patient agreed with the plan and demonstrated an understanding of the instructions.   The patient was advised to call back or seek an in-person evaluation if the symptoms worsen or if the condition fails to improve as anticipated.  I provided 23 minutes of non-face-to-face time during this encounter.   Fenton Foy, NP

## 2020-09-26 NOTE — Progress Notes (Unsigned)
Virtual Visit via Telephone Note  I connected with Roger Saunders on 09/26/20 at  by telephone and verified that I am speaking with the correct person using two identifiers.  Location: Patient: home Provider: office   I discussed the limitations, risks, security and privacy concerns of performing an evaluation and management service by telephone and the availability of in person appointments. I also discussed with the patient that there may be a patient responsible charge related to this service. The patient expressed understanding and agreed to proceed.   History of Present Illness:  Patient presents today for post-COVID care clinic visit through televisit.  Patient states that he tested positive for COVID on 09/20/2018.  His symptom onset was 09/17/2020.  Patient has been fully vaccinated with booster.  Patient was tested through [facility] and was ordered oral antiviral therapy for [facility].  Patient states today is his last dose of Molnupiravir.  Patient states that overall he is doing well.  He has been afebrile for 2 days.  He states that he does have slight cough and slight sinus congestion.  He declines chest x-ray at this time.  He states that overall he is feeling well and does not feel that he has pneumonia.  He states that he is improving daily. Denies f/c/s, n/v/d, hemoptysis, PND, chest pain or edema.      Observations/Objective:  Vitals with BMI 03/31/2018 03/31/2018 03/31/2018  Height - - -  Weight - - -  BMI - - -  Systolic [phone]  Diastolic 61 55 50  Pulse 74 58 55      Assessment and Plan:   Follow Up Instructions:    I discussed the assessment and treatment plan with the patient. The patient was provided an opportunity to ask questions and all were answered. The patient agreed with the plan and demonstrated an understanding of the instructions.   The patient was advised to call back or seek an in-person evaluation if the symptoms worsen or if the condition  fails to improve as anticipated.  I provided *** minutes of non-face-to-face time during this encounter.   Fenton Foy, NP

## 2020-10-10 ENCOUNTER — Ambulatory Visit (INDEPENDENT_AMBULATORY_CARE_PROVIDER_SITE_OTHER): Payer: Medicare HMO | Admitting: Nurse Practitioner

## 2020-10-10 VITALS — BP 139/77 | HR 76 | Temp 97.9°F | Resp 18

## 2020-10-10 DIAGNOSIS — Z8616 Personal history of COVID-19: Secondary | ICD-10-CM | POA: Diagnosis not present

## 2020-10-10 NOTE — Patient Instructions (Addendum)
Covid 19:   Stay well hydrated  Stay active  Deep breathing exercises  May start vitamin C daily, vitamin D3 daily, Zinc daily  May take tylenol for fever or pain     Follow up:  Follow up if needed   

## 2020-10-10 NOTE — Progress Notes (Signed)
@Patient  ID: Roger Saunders, male    DOB: 1932/01/24, 87 y.o.   MRN: [id number]  Chief Complaint  Patient presents with  . Follow-up    Referring provider: Jani Gravel, MD    HPI  Patient presents today for post-COVID care clinic visit follow-up.  Patient states that since his last visit he has been doing well.  He denies any significant shortness of breath.  He did previously complete antiviral therapy.  He no longer has a cough or wheeze.Denies f/c/s, n/v/d, hemoptysis, PND, chest pain or edema.      Allergies  Allergen Reactions  . Lipitor [Atorvastatin Calcium] Other (See Comments)    Legs hurt    Immunization History  Administered Date(s) Administered  . PFIZER(Purple Top)SARS-COV-2 Vaccination 07/05/2019, 07/26/2019    Past Medical History:  Diagnosis Date  . Arthritis   . Conjunctivitis, chronic   . Depression    situational  . GERD (gastroesophageal reflux disease)    occasionally  . History of kidney stones   . Hypertension     Tobacco History: Social History   Tobacco Use  Smoking Status Former Smoker  . Types: Pipe, Cigars  . Quit date: 2007  . Years since quitting: 15.3  Smokeless Tobacco Never Used   Counseling given: Yes   Outpatient Encounter Medications as of 10/10/2020  Medication Sig  . amLODipine (NORVASC) 5 MG tablet Take 5 mg by mouth every evening.   Marland Kitchen aspirin EC 81 MG tablet Take 1 tablet (81 mg total) by mouth 2 (two) times daily.  . Calcium Carbonate (CALCIUM 600 PO) Take 600 mg by mouth 2 (two) times daily.  . calcium elemental as carbonate (TUMS ULTRA 1000) 400 MG chewable tablet Chew 2,000 mg by mouth daily as needed for heartburn.  . Cholecalciferol (VITAMIN D3) 2000 units TABS Take 4,000 Units by mouth every evening.  Marland Kitchen losartan (COZAAR) 100 MG tablet Take 100 mg by mouth every evening.   . Molnupiravir 200 MG CAPS Take 4 capsules by mouth 2 (two) times daily.  Marland Kitchen oxyCODONE-acetaminophen (PERCOCET/ROXICET) 5-325 MG tablet Take 1  tablet by mouth every 4 (four) hours as needed for severe pain.  Vladimir Faster Glycol-Propyl Glycol (SYSTANE) 0.4-0.3 % SOLN Place 1 drop into both eyes daily as needed (for dry eyes).  Marland Kitchen tiZANidine (ZANAFLEX) 2 MG tablet Take 1 tablet (2 mg total) by mouth every 6 (six) hours as needed.   No facility-administered encounter medications on file as of 10/10/2020.     Review of Systems  Review of Systems  Constitutional: Negative.  Negative for fatigue and fever.  HENT: Negative.   Respiratory: Negative for cough and shortness of breath.   Cardiovascular: Negative.  Negative for chest pain, palpitations and leg swelling.  Gastrointestinal: Negative.   Allergic/Immunologic: Negative.   Neurological: Negative.   Psychiatric/Behavioral: Negative.        Physical Exam  BP 139/77   Pulse 76   Temp 97.9 F (36.6 C)   Resp 18   SpO2 97%   Wt Readings from Last 5 Encounters:  03/30/18 198 lb (89.8 kg)  03/24/18 198 lb (89.8 kg)  07/26/16 194 lb 14.4 oz (88.4 kg)  07/02/16 197 lb (89.4 kg)  11/29/15 200 lb (90.7 kg)     Physical Exam Vitals and nursing note reviewed.  Constitutional:      General: He is not in acute distress.    Appearance: He is well-developed.  Cardiovascular:     Rate and Rhythm: Normal rate and  regular rhythm.  Pulmonary:     Effort: Pulmonary effort is normal.     Breath sounds: Normal breath sounds.  Musculoskeletal:     Right lower leg: No edema.     Left lower leg: No edema.  Skin:    General: Skin is warm and dry.  Neurological:     Mental Status: He is alert and oriented to person, place, and time.  Psychiatric:        Mood and Affect: Mood normal.        Behavior: Behavior normal.       Assessment & Plan:   History of COVID-19 Covid 19:   Stay well hydrated  Stay active  Deep breathing exercises  May start vitamin C daily, vitamin D3 daily, Zinc daily  May take tylenol for fever or pain     Follow up:  Follow up if  needed       Fenton Foy, NP 10/10/2020

## 2020-10-10 NOTE — Assessment & Plan Note (Signed)
Covid 19:   Stay well hydrated  Stay active  Deep breathing exercises  May start vitamin C daily, vitamin D3 daily, Zinc daily  May take tylenol for fever or pain     Follow up:  Follow up if needed

## 2020-10-25 ENCOUNTER — Other Ambulatory Visit: Payer: Self-pay

## 2020-10-25 ENCOUNTER — Encounter (HOSPITAL_BASED_OUTPATIENT_CLINIC_OR_DEPARTMENT_OTHER): Payer: Self-pay | Admitting: Emergency Medicine

## 2020-10-25 ENCOUNTER — Emergency Department (HOSPITAL_BASED_OUTPATIENT_CLINIC_OR_DEPARTMENT_OTHER)
Admission: EM | Admit: 2020-10-25 | Discharge: 2020-10-25 | Disposition: A | Discharge status: Home / Self Care | Payer: Medicare HMO | Attending: Emergency Medicine | Admitting: Emergency Medicine

## 2020-10-25 ENCOUNTER — Emergency Department (HOSPITAL_BASED_OUTPATIENT_CLINIC_OR_DEPARTMENT_OTHER): Payer: Medicare HMO

## 2020-10-25 DIAGNOSIS — Z7982 Long term (current) use of aspirin: Secondary | ICD-10-CM | POA: Insufficient documentation

## 2020-10-25 DIAGNOSIS — Z96641 Presence of right artificial hip joint: Secondary | ICD-10-CM | POA: Insufficient documentation

## 2020-10-25 DIAGNOSIS — Z743 Need for continuous supervision: Secondary | ICD-10-CM | POA: Diagnosis not present

## 2020-10-25 DIAGNOSIS — Z8616 Personal history of COVID-19: Secondary | ICD-10-CM | POA: Diagnosis not present

## 2020-10-25 DIAGNOSIS — M545 Low back pain, unspecified: Secondary | ICD-10-CM

## 2020-10-25 DIAGNOSIS — Z79899 Other long term (current) drug therapy: Secondary | ICD-10-CM | POA: Insufficient documentation

## 2020-10-25 DIAGNOSIS — I10 Essential (primary) hypertension: Secondary | ICD-10-CM | POA: Diagnosis not present

## 2020-10-25 DIAGNOSIS — Z87891 Personal history of nicotine dependence: Secondary | ICD-10-CM | POA: Diagnosis not present

## 2020-10-25 DIAGNOSIS — M549 Dorsalgia, unspecified: Secondary | ICD-10-CM | POA: Diagnosis not present

## 2020-10-25 LAB — BASIC METABOLIC PANEL
Anion gap: 9 (ref 5–15)
BUN: 24 mg/dL — ABNORMAL HIGH (ref 8–23)
CO2: 25 mmol/L (ref 22–32)
Calcium: 9.3 mg/dL (ref 8.9–10.3)
Chloride: 105 mmol/L (ref 98–111)
Creatinine, Ser: 0.88 mg/dL (ref 0.61–1.24)
GFR, Estimated: >60 mL/min (ref >60)
Glucose, Bld: 116 mg/dL — ABNORMAL HIGH (ref 70–99)
Potassium: 3.5 mmol/L (ref 3.5–5.1)
Sodium: 139 mmol/L (ref 135–145)

## 2020-10-25 LAB — CBC
HCT: 39.3 % (ref 39.0–52.0)
Hemoglobin: 13.3 g/dL (ref 13.0–17.0)
MCH: 33.7 pg (ref 26.0–34.0)
MCHC: 33.8 g/dL (ref 30.0–36.0)
MCV: 99.5 fL (ref 80.0–100.0)
Platelets: 142 10*3/uL — ABNORMAL LOW (ref 150–400)
RBC: 3.95 MIL/uL — ABNORMAL LOW (ref 4.22–5.81)
RDW: 11.9 % (ref 11.5–15.5)
WBC: 5.9 10*3/uL (ref 4.0–10.5)
nRBC: 0 % (ref 0.0–0.2)

## 2020-10-25 MED ORDER — MORPHINE SULFATE (PF) 4 MG/ML IV SOLN
4.0000 mg | Freq: Once | INTRAVENOUS | Status: AC
  Administered 2020-10-25: 4 mg via INTRAVENOUS
  Filled 2020-10-25: qty 1

## 2020-10-25 MED ORDER — ONDANSETRON HCL 4 MG/2ML IJ SOLN
4.0000 mg | Freq: Once | INTRAMUSCULAR | Status: AC
  Administered 2020-10-25: 4 mg via INTRAVENOUS
  Filled 2020-10-25: qty 2

## 2020-10-25 MED ORDER — OXYCODONE-ACETAMINOPHEN 5-325 MG PO TABS: 1.0000 | ORAL_TABLET | Freq: Four times a day (QID) | ORAL | 0 refills | Status: AC | PRN

## 2020-10-25 MED ORDER — KETOROLAC TROMETHAMINE 30 MG/ML IJ SOLN
15.0000 mg | Freq: Once | INTRAMUSCULAR | Status: AC
  Administered 2020-10-25: 15 mg via INTRAVENOUS
  Filled 2020-10-25: qty 1

## 2020-10-25 MED ORDER — MELOXICAM 7.5 MG PO TABS: 7.5000 mg | ORAL_TABLET | Freq: Every day | ORAL | 0 refills | Status: AC

## 2020-10-25 MED ORDER — DIAZEPAM 2 MG PO TABS: 2.0000 mg | ORAL_TABLET | Freq: Four times a day (QID) | ORAL | 0 refills | Status: AC | PRN

## 2020-10-25 MED ORDER — DIAZEPAM 2 MG PO TABS
2.0000 mg | ORAL_TABLET | Freq: Once | ORAL | Status: AC
  Administered 2020-10-25: 2 mg via ORAL
  Filled 2020-10-25: qty 1

## 2020-10-25 NOTE — ED Triage Notes (Signed)
Pt presents to ED BIB [facility]. Pt c/o lower back pain x1w. No trauma/injury. Unable to e seen by PCP. Pt cannot move without pain since trying to get up tonight.

## 2020-10-25 NOTE — ED Provider Notes (Signed)
[facility] EMERGENCY DEPT Provider Note   CSN: [id number] Arrival date & time: 10/25/20  0158     History Chief Complaint  Patient presents with  . Back Pain    Roger Saunders is a 87 y.o. male.  Patient presents to the emergency department for evaluation of back pain.  Patient reports that he has been experiencing increased low back pain for the last 7 to 10 days.  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  Tonight the pain is much worse.  He has a constant dull aching pain in the lower back without radiation.  He has intermittent sharp stabbing spasms of pain that occur with movement.        Past Medical History:  Diagnosis Date  . Arthritis   . Conjunctivitis, chronic   . Depression    situational  . GERD (gastroesophageal reflux disease)    occasionally  . History of kidney stones   . Hypertension     Patient Active Problem List   Diagnosis Date Noted  . History of COVID-19 10/10/2020  . COVID-19 09/26/2020  . Primary osteoarthritis of right hip 03/30/2018  . 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 Hip fracture, right ([place]) 07/30/2016  . Closed right hip fracture ([place]) 07/01/2016  . GERD (gastroesophageal reflux disease) 07/01/2016  . Essential hypertension   . Arthritis   . Fall     Past Surgical History:  Procedure Laterality Date  . APPENDECTOMY  1938  . COLONOSCOPY    . HIP PINNING,CANNULATED Right 07/02/2016   Procedure: CANNULATED HIP PINNING;  Surgeon: Renette Butters, MD;  Location: [facility];  Service: Orthopedics;  Laterality: Right;  . KIDNEY STONE SURGERY    . TOTAL HIP ARTHROPLASTY Right 07/30/2016   Procedure: RIGHT HEMI HIP ARTHROPLASTY;  Surgeon: Renette Butters, MD;  Location: [facility];  Service: Orthopedics;  Laterality: Right;  . TOTAL HIP REVISION Right 03/30/2018   Procedure: RIGHT TOTAL HIP REVISION POSTERIOR APPROACH;   Surgeon: Frederik Pear, MD;  Location: [facility];  Service: Orthopedics;  Laterality: Right;       Family History  Problem Relation Age of Onset  . Heart attack Mother   . Prostate cancer Father   . Parkinson's disease Brother     Social History   Tobacco Use  . Smoking status: Former Smoker    Types: Pipe, Cigars    Quit date: 2007    Years since quitting: 15.3  . Smokeless tobacco: Never Used  Substance Use Topics  . Alcohol use: Yes    Alcohol/week: 7.0 standard drinks    Types: 7 Glasses of wine per week    Comment: "a litle bit of red wine"  . Drug use: No    Home Medications Prior to Admission medications   Medication Sig Start Date End Date Taking? Authorizing Provider  amLODipine (NORVASC) 5 MG tablet Take 5 mg by mouth every evening.     [provider]  aspirin EC 81 MG tablet Take 1 tablet (81 mg total) by mouth 2 (two) times daily. 03/30/18   Leighton Parody, PA-C  Calcium Carbonate (CALCIUM 600 PO) Take 600 mg by mouth 2 (two) times daily.    [provider]  calcium elemental as carbonate (TUMS ULTRA 1000) 400 MG chewable tablet Chew 2,000 mg by mouth daily as needed for heartburn.    [provider]  Cholecalciferol (  VITAMIN D3) 2000 units TABS Take 4,000 Units by mouth every evening.    [provider]  losartan (COZAAR) 100 MG tablet Take 100 mg by mouth every evening.     [provider]  Molnupiravir 200 MG CAPS Take 4 capsules by mouth 2 (two) times daily. 09/21/20   [provider]  oxyCODONE-acetaminophen (PERCOCET/ROXICET) 5-325 MG tablet Take 1 tablet by mouth every 4 (four) hours as needed for severe pain. 03/30/18   Leighton Parody, PA-C  Polyethyl Glycol-Propyl Glycol (SYSTANE) 0.4-0.3 % SOLN Place 1 drop into both eyes daily as needed (for dry eyes).    [provider]  tiZANidine (ZANAFLEX) 2 MG tablet Take 1 tablet (2 mg total) by mouth every 6 (six) hours as needed. 03/30/18   Leighton Parody, PA-C    Allergies    Lipitor [atorvastatin calcium]  Review of Systems   Review of Systems  Musculoskeletal: Positive for back pain.  All other systems reviewed and are negative.   Physical Exam Updated Vital Signs BP 133/67 (BP Location: Right Arm)   Pulse 68   Temp 98 F (36.7 C) (Oral)   Resp 18   Ht 5\' 6"  (1.676 m)   Wt 97.5 kg   SpO2 96%   BMI 34.70 kg/m   Physical Exam Vitals and nursing note reviewed.  Constitutional:      General: He is not in acute distress.    Appearance: Normal appearance. He is well-developed.  HENT:     Head: Normocephalic and atraumatic.     Right Ear: Hearing normal.     Left Ear: Hearing normal.     Nose: Nose normal.  Eyes:     Conjunctiva/sclera: Conjunctivae normal.     Pupils: Pupils are equal, round, and reactive to light.  Cardiovascular:     Rate and Rhythm: Regular rhythm.     Pulses:          Dorsalis pedis pulses are 1+ on the right side and 1+ on the left side.     Heart sounds: S1 normal and S2 normal. No murmur heard. No friction rub. No gallop.   Pulmonary:     Effort: Pulmonary effort is normal. No respiratory distress.     Breath sounds: Normal breath sounds.  Chest:     Chest wall: No tenderness.  Abdominal:     General: Bowel sounds are normal.     Palpations: Abdomen is soft.     Tenderness: There is no abdominal tenderness. There is no guarding or rebound. Negative signs include Murphy's sign and McBurney's sign.     Hernia: No hernia is present.  Musculoskeletal:     Cervical back: Normal range of motion and neck supple.     Lumbar back: Spasms and tenderness present. Decreased range of motion.  Skin:    General: Skin is warm and dry.     Findings: No rash.  Neurological:     Mental Status: He is alert and oriented to person, place, and time.     GCS: GCS eye subscore is 4. GCS verbal subscore is 5. GCS motor subscore is 6.     Cranial Nerves: No cranial nerve deficit.     Sensory: No sensory  deficit.     Coordination: Coordination normal.     Deep Tendon Reflexes:     Reflex Scores:      Patellar reflexes are 2+ on the right side and 2+ on the left side. Psychiatric:  Speech: Speech normal.        Behavior: Behavior normal.        Thought Content: Thought content normal.     ED Results / Procedures / Treatments   Labs (all labs ordered are listed, but only abnormal results are displayed) Labs Reviewed  CBC - Abnormal; Notable for the following components:      Result Value   RBC 3.95 (*)    Platelets 142 (*)    All other components within normal limits  BASIC METABOLIC PANEL - Abnormal; Notable for the following components:   Glucose, Bld 116 (*)    BUN 24 (*)    All other components within normal limits    EKG None  Radiology CT Lumbar Spine Wo Contrast  Result Date: 10/25/2020 CLINICAL DATA:  Low back pain EXAM: CT LUMBAR SPINE WITHOUT CONTRAST TECHNIQUE: Multidetector CT imaging of the lumbar spine was performed without intravenous contrast administration. Multiplanar CT image reconstructions were also generated. COMPARISON:  None. FINDINGS: Segmentation: Standard Alignment: Grade 1 anterolisthesis at L4-5 Vertebrae: No acute fracture or focal pathologic process. Paraspinal and other soft tissues: Nonobstructive right nephrolithiasis. Calcific aortic atherosclerosis. Sigmoid diverticulosis. Disc levels: L1-2: Endplate spurring and facet arthrosis with mild-to-moderate spinal canal stenosis. L2-3: Moderate spinal canal stenosis due to disc bulge and facet hypertrophy. L3-4: Moderate spinal canal stenosis with moderate-to-severe right foraminal stenosis due to disc bulge and endplate spurring. Moderate facet hypertrophy. L4-5: Severe facet arthrosis with intermediate disc bulge. Severe spinal canal stenosis. Mild-to-moderate bilateral foraminal stenosis. L5-S1: Moderate facet hypertrophy. Small disc bulge with endplate spurring. Mild right and moderate left  foraminal stenosis. IMPRESSION: 1. No acute fracture or static subluxation of the lumbar spine. 2. Grade 1 L4-5 anterolisthesis due to severe facet arthrosis, which may be a source of local low back pain. 3. Severe spinal canal stenosis at L4-5. 4. Moderate-to-severe right foraminal stenosis at L3-4. 5. Moderate spinal canal stenosis at L2-3, L3-4 and L5-S1. 6. Nonobstructive right nephrolithiasis. Aortic Atherosclerosis (ICD10-I70.0). Electronically Signed   By: Ulyses Jarred M.D.   On: 10/25/2020 03:09    Procedures Procedures   Medications Ordered in ED Medications  morphine 4 MG/ML injection 4 mg (4 mg Intravenous Given 10/25/20 0228)  ondansetron (ZOFRAN) injection 4 mg (4 mg Intravenous Given 10/25/20 0229)  morphine 4 MG/ML injection 4 mg (4 mg Intravenous Given 10/25/20 0347)  ketorolac (TORADOL) 30 MG/ML injection 15 mg (15 mg Intravenous Given 10/25/20 0426)    ED Course  I have reviewed the triage vital signs and the nursing notes.  Pertinent labs & imaging results that were available during my care of the patient were reviewed by me and considered in my medical decision making (see chart for details).    MDM Rules/Calculators/A&P                          Patient presents to the emergency department for evaluation of low back pain.  He has been noticing low back pain for approximately 10 days but since he was on his feet all day yesterday his pain is significantly worsened.  Patient having continuous pain but he also is having intermittent spasms.  Pain is not radicular.  He has no radiation down his legs, no weakness in the legs.  He has normal reflexes and normal strength.  No saddle anesthesia.  No foot drop.  He has not had any change in bowel or bladder function.  Patient has distal pulses  present.  No red flags.  He did have a CT scan that shows fairly significant degenerative changes but nothing obviously acute.  Patient treated with analgesia.  He has follow-up scheduled in 2  weeks with orthopedics.  He will call and try and get follow-up sooner.  Final Clinical Impression(s) / ED Diagnoses Final diagnoses:  Acute bilateral low back pain without sciatica    Rx / DC Orders ED Discharge Orders    None       Orpah Greek, MD 10/25/20 [phone]

## 2020-11-01 DIAGNOSIS — M7061 Trochanteric bursitis, right hip: Secondary | ICD-10-CM | POA: Diagnosis not present

## 2020-11-10 DIAGNOSIS — Z23 Encounter for immunization: Secondary | ICD-10-CM | POA: Diagnosis not present

## 2020-11-13 DIAGNOSIS — M5416 Radiculopathy, lumbar region: Secondary | ICD-10-CM | POA: Diagnosis not present

## 2020-11-20 DIAGNOSIS — M545 Low back pain, unspecified: Secondary | ICD-10-CM | POA: Diagnosis not present

## 2020-11-22 DIAGNOSIS — I10 Essential (primary) hypertension: Secondary | ICD-10-CM | POA: Diagnosis not present

## 2020-11-22 DIAGNOSIS — E7800 Pure hypercholesterolemia, unspecified: Secondary | ICD-10-CM | POA: Diagnosis not present

## 2020-11-22 DIAGNOSIS — R739 Hyperglycemia, unspecified: Secondary | ICD-10-CM | POA: Diagnosis not present

## 2020-11-24 DIAGNOSIS — M545 Low back pain, unspecified: Secondary | ICD-10-CM | POA: Diagnosis not present

## 2020-11-29 DIAGNOSIS — E559 Vitamin D deficiency, unspecified: Secondary | ICD-10-CM | POA: Diagnosis not present

## 2020-11-29 DIAGNOSIS — M1991 Primary osteoarthritis, unspecified site: Secondary | ICD-10-CM | POA: Diagnosis not present

## 2020-11-29 DIAGNOSIS — Z125 Encounter for screening for malignant neoplasm of prostate: Secondary | ICD-10-CM | POA: Diagnosis not present

## 2020-11-29 DIAGNOSIS — I10 Essential (primary) hypertension: Secondary | ICD-10-CM | POA: Diagnosis not present

## 2020-11-29 DIAGNOSIS — K219 Gastro-esophageal reflux disease without esophagitis: Secondary | ICD-10-CM | POA: Diagnosis not present

## 2020-11-29 DIAGNOSIS — E7800 Pure hypercholesterolemia, unspecified: Secondary | ICD-10-CM | POA: Diagnosis not present

## 2020-11-29 DIAGNOSIS — R7303 Prediabetes: Secondary | ICD-10-CM | POA: Diagnosis not present

## 2021-02-13 DIAGNOSIS — L299 Pruritus, unspecified: Secondary | ICD-10-CM | POA: Diagnosis not present

## 2021-02-13 DIAGNOSIS — H938X3 Other specified disorders of ear, bilateral: Secondary | ICD-10-CM | POA: Diagnosis not present

## 2021-02-27 DIAGNOSIS — E785 Hyperlipidemia, unspecified: Secondary | ICD-10-CM | POA: Diagnosis not present

## 2021-02-27 DIAGNOSIS — M4800 Spinal stenosis, site unspecified: Secondary | ICD-10-CM | POA: Diagnosis not present

## 2021-02-27 DIAGNOSIS — M1990 Unspecified osteoarthritis, unspecified site: Secondary | ICD-10-CM | POA: Diagnosis not present

## 2021-02-27 DIAGNOSIS — Z800 Family history of malignant neoplasm of digestive organs: Secondary | ICD-10-CM | POA: Diagnosis not present

## 2021-02-27 DIAGNOSIS — K08409 Partial loss of teeth, unspecified cause, unspecified class: Secondary | ICD-10-CM | POA: Diagnosis not present

## 2021-02-27 DIAGNOSIS — I10 Essential (primary) hypertension: Secondary | ICD-10-CM | POA: Diagnosis not present

## 2021-02-27 DIAGNOSIS — Z008 Encounter for other general examination: Secondary | ICD-10-CM | POA: Diagnosis not present

## 2021-02-27 DIAGNOSIS — K219 Gastro-esophageal reflux disease without esophagitis: Secondary | ICD-10-CM | POA: Diagnosis not present

## 2021-02-27 DIAGNOSIS — E669 Obesity, unspecified: Secondary | ICD-10-CM | POA: Diagnosis not present

## 2021-02-27 DIAGNOSIS — Z820 Family history of epilepsy and other diseases of the nervous system: Secondary | ICD-10-CM | POA: Diagnosis not present

## 2021-02-27 DIAGNOSIS — Z6830 Body mass index (BMI) 30.0-30.9, adult: Secondary | ICD-10-CM | POA: Diagnosis not present

## 2021-03-04 DIAGNOSIS — I10 Essential (primary) hypertension: Secondary | ICD-10-CM | POA: Diagnosis not present

## 2021-03-04 DIAGNOSIS — W57XXXA Bitten or stung by nonvenomous insect and other nonvenomous arthropods, initial encounter: Secondary | ICD-10-CM | POA: Diagnosis not present

## 2021-03-04 DIAGNOSIS — S80861A Insect bite (nonvenomous), right lower leg, initial encounter: Secondary | ICD-10-CM | POA: Diagnosis not present

## 2021-03-04 DIAGNOSIS — T63301A Toxic effect of unspecified spider venom, accidental (unintentional), initial encounter: Secondary | ICD-10-CM | POA: Diagnosis not present

## 2021-03-09 DIAGNOSIS — Z23 Encounter for immunization: Secondary | ICD-10-CM | POA: Diagnosis not present

## 2021-03-21 DIAGNOSIS — N200 Calculus of kidney: Secondary | ICD-10-CM | POA: Diagnosis not present

## 2021-03-21 DIAGNOSIS — R351 Nocturia: Secondary | ICD-10-CM | POA: Diagnosis not present

## 2021-03-25 ENCOUNTER — Emergency Department (HOSPITAL_BASED_OUTPATIENT_CLINIC_OR_DEPARTMENT_OTHER)
Admission: EM | Admit: 2021-03-25 | Discharge: 2021-03-25 | Disposition: A | Discharge status: Home / Self Care | Payer: Medicare HMO | Attending: Emergency Medicine | Admitting: Emergency Medicine

## 2021-03-25 ENCOUNTER — Emergency Department (HOSPITAL_BASED_OUTPATIENT_CLINIC_OR_DEPARTMENT_OTHER): Payer: Medicare HMO

## 2021-03-25 ENCOUNTER — Encounter (HOSPITAL_BASED_OUTPATIENT_CLINIC_OR_DEPARTMENT_OTHER): Payer: Self-pay | Admitting: Obstetrics and Gynecology

## 2021-03-25 ENCOUNTER — Other Ambulatory Visit: Payer: Self-pay

## 2021-03-25 DIAGNOSIS — K219 Gastro-esophageal reflux disease without esophagitis: Secondary | ICD-10-CM | POA: Insufficient documentation

## 2021-03-25 DIAGNOSIS — Z96641 Presence of right artificial hip joint: Secondary | ICD-10-CM | POA: Insufficient documentation

## 2021-03-25 DIAGNOSIS — D696 Thrombocytopenia, unspecified: Secondary | ICD-10-CM | POA: Diagnosis not present

## 2021-03-25 DIAGNOSIS — I10 Essential (primary) hypertension: Secondary | ICD-10-CM | POA: Diagnosis not present

## 2021-03-25 DIAGNOSIS — R197 Diarrhea, unspecified: Secondary | ICD-10-CM | POA: Diagnosis present

## 2021-03-25 DIAGNOSIS — N200 Calculus of kidney: Secondary | ICD-10-CM | POA: Diagnosis not present

## 2021-03-25 DIAGNOSIS — Z79899 Other long term (current) drug therapy: Secondary | ICD-10-CM | POA: Diagnosis not present

## 2021-03-25 DIAGNOSIS — K5730 Diverticulosis of large intestine without perforation or abscess without bleeding: Secondary | ICD-10-CM | POA: Diagnosis not present

## 2021-03-25 DIAGNOSIS — A09 Infectious gastroenteritis and colitis, unspecified: Secondary | ICD-10-CM | POA: Insufficient documentation

## 2021-03-25 DIAGNOSIS — Z7982 Long term (current) use of aspirin: Secondary | ICD-10-CM | POA: Insufficient documentation

## 2021-03-25 DIAGNOSIS — Z8616 Personal history of COVID-19: Secondary | ICD-10-CM | POA: Diagnosis not present

## 2021-03-25 DIAGNOSIS — Z87891 Personal history of nicotine dependence: Secondary | ICD-10-CM | POA: Diagnosis not present

## 2021-03-25 LAB — URINALYSIS, ROUTINE W REFLEX MICROSCOPIC
Bilirubin Urine: NEGATIVE
Glucose, UA: NEGATIVE mg/dL
Hgb urine dipstick: NEGATIVE
Ketones, ur: NEGATIVE mg/dL
Leukocytes,Ua: NEGATIVE
Nitrite: NEGATIVE
Protein, ur: NEGATIVE mg/dL
Specific Gravity, Urine: 1.022 (ref 1.005–1.030)
pH: 5.5 (ref 5.0–8.0)

## 2021-03-25 LAB — COMPREHENSIVE METABOLIC PANEL
ALT: 32 U/L (ref 0–44)
AST: 30 U/L (ref 15–41)
Albumin: 4.6 g/dL (ref 3.5–5.0)
Alkaline Phosphatase: 43 U/L (ref 38–126)
Anion gap: 7 (ref 5–15)
BUN: 19 mg/dL (ref 8–23)
CO2: 31 mmol/L (ref 22–32)
Calcium: 10 mg/dL (ref 8.9–10.3)
Chloride: 101 mmol/L (ref 98–111)
Creatinine, Ser: 0.98 mg/dL (ref 0.61–1.24)
GFR, Estimated: >60 mL/min (ref >60)
Glucose, Bld: 125 mg/dL — ABNORMAL HIGH (ref 70–99)
Potassium: 3.8 mmol/L (ref 3.5–5.1)
Sodium: 139 mmol/L (ref 135–145)
Total Bilirubin: 0.9 mg/dL (ref 0.3–1.2)
Total Protein: 7.1 g/dL (ref 6.5–8.1)

## 2021-03-25 LAB — CBC
HCT: 42.9 % (ref 39.0–52.0)
Hemoglobin: 14.5 g/dL (ref 13.0–17.0)
MCH: 33.8 pg (ref 26.0–34.0)
MCHC: 33.8 g/dL (ref 30.0–36.0)
MCV: 100 fL (ref 80.0–100.0)
Platelets: 148 10*3/uL — ABNORMAL LOW (ref 150–400)
RBC: 4.29 MIL/uL (ref 4.22–5.81)
RDW: 11.8 % (ref 11.5–15.5)
WBC: 5.8 10*3/uL (ref 4.0–10.5)
nRBC: 0 % (ref 0.0–0.2)

## 2021-03-25 LAB — C DIFFICILE QUICK SCREEN W PCR REFLEX
C Diff antigen: NEGATIVE
C Diff interpretation: NOT DETECTED
C Diff toxin: NEGATIVE

## 2021-03-25 LAB — LIPASE, BLOOD: Lipase: 12 U/L (ref 11–51)

## 2021-03-25 MED ORDER — IOHEXOL 300 MG/ML  SOLN
100.0000 mL | Freq: Once | INTRAMUSCULAR | Status: AC | PRN
  Administered 2021-03-25: 100 mL via INTRAVENOUS

## 2021-03-25 MED ORDER — SODIUM CHLORIDE 0.9 % IV BOLUS
1000.0000 mL | Freq: Once | INTRAVENOUS | Status: AC
  Administered 2021-03-25: 1000 mL via INTRAVENOUS

## 2021-03-25 NOTE — ED Triage Notes (Signed)
Patient reports to the ER for diarrhea following eating for the last 24 hours. Patient reports taking Kaapectate for the diarrhea and it cleared up and then following eating lunch the diarrhea started once more.

## 2021-03-25 NOTE — Discharge Instructions (Signed)
Your work-up in the emergency department was very reassuring.  I did send a C. difficile test and the results will be available tomorrow.  If your test is positive I will call you to let you know.  If your test is negative you will not be called.  Please make sure to stay well-hydrated and stick to the brat diet which includes eating bananas, rice, applesauce and toast.  This should help improve your symptoms of diarrhea.  Use to schedule an appointment with your regular doctor in the next 3 to 5 days for reassessment.  If you have any new or worsening symptoms in the meantime such as fevers, dehydration, bloody stools, persistent nausea vomiting or severe abdominal pain then you will need to return to the emergency department immediately.

## 2021-03-25 NOTE — ED Notes (Signed)
Pt verbalizes understanding of discharge instructions. Opportunity for questioning and answers were provided. Armand removed by staff, pt discharged from ED to home. Educated to f/u with PCP.  

## 2021-03-25 NOTE — ED Notes (Signed)
Patient has been drinking water in lobby. RN was called to patient and patient stated "Will I be seen before 6pm?" RN stated "I apologize for the wait sir but this is the ER and we are working as diligently as we can to get people seen" Patient unsatisfied with nurse response and stated "I should have gone to [facility] in [place]"

## 2021-03-25 NOTE — ED Provider Notes (Signed)
[facility] EMERGENCY DEPT Provider Note   CSN: [id number] Arrival date & time: 03/25/21  1402     History Chief Complaint  Patient presents with   Diarrhea    Roger Saunders is a 87 y.o. male.  HPI   Pt is an 87 y/o male with a h/o arthritis, depression, GERD, nephrolithiasis, HTN, who presents to the ED today for eval of diarrhea that started last night. States he has had at least 10 episodes of diarrhea today. He has had 4 episodes since being in the emergency department for the last 3 hours. Denies any bloody or tarry stools. Denies associated nausea, vomiting or fevers.   He took a course of doxycycline 2 months ago but otherwise denies any abx use. 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   Hip fracture, right ([place]) 07/30/2016   Closed right hip fracture ([place]) 07/01/2016   GERD (gastroesophageal reflux disease) 07/01/2016   Essential hypertension    Arthritis    Fall     Past Surgical History:  Procedure Laterality Date   APPENDECTOMY  1938   COLONOSCOPY     HIP PINNING,CANNULATED Right 07/02/2016   Procedure: CANNULATED HIP PINNING;  Surgeon: Renette Butters, MD;  Location: [facility];  Service: Orthopedics;  Laterality: Right;   KIDNEY STONE SURGERY     TOTAL HIP ARTHROPLASTY Right 07/30/2016   Procedure: RIGHT HEMI HIP ARTHROPLASTY;  Surgeon: Renette Butters, MD;  Location: [facility];  Service: Orthopedics;  Laterality: Right;   TOTAL HIP REVISION Right 03/30/2018   Procedure: RIGHT TOTAL HIP REVISION POSTERIOR APPROACH;  Surgeon: Frederik Pear, MD;  Location: [facility];  Service: Orthopedics;  Laterality: Right;       Family History  Problem Relation Age of Onset   Heart attack Mother    Prostate cancer Father    Parkinson's disease Brother     Social History   Tobacco Use   Smoking status: Former    Types: Pipe, Cigars    Quit date: 2007    Years since quitting: 15.8   Smokeless tobacco: Never  Vaping Use   Vaping Use: Never used  Substance Use Topics   Alcohol use: Yes    Alcohol/week: 7.0 standard drinks    Types: 7 Glasses of wine per week    Comment: "a litle bit of red wine"   Drug use: No    Home Medications Prior to Admission medications   Medication Sig Start Date End Date Taking? Authorizing Provider  amLODipine (NORVASC) 5 MG tablet Take 5 mg by mouth every evening.    Yes [provider]  Calcium Carbonate (CALCIUM 600 PO) Take 600 mg by mouth 2 (two) times daily.   Yes [provider]  calcium elemental as carbonate (BARIATRIC TUMS ULTRA) 400 MG chewable tablet Chew 2,000 mg by mouth daily as needed for heartburn.   Yes [provider]  Cholecalciferol (VITAMIN D3) 2000 units TABS Take 4,000 Units by mouth every evening.   Yes [provider]  losartan (COZAAR) 100 MG tablet Take 100 mg by mouth every evening.    Yes [provider]  aspirin EC 81 MG tablet Take 1 tablet (81 mg total) by mouth 2 (two) times daily. 03/30/18   Leighton Parody, PA-C  diazepam (VALIUM) 2 MG tablet Take 1 tablet (2 mg total) by mouth every 6 (six) hours as needed for muscle spasms. 10/25/20   Orpah Greek, MD  meloxicam (MOBIC) 7.5 MG tablet Take 1 tablet (7.5 mg total) by mouth daily. 10/25/20   Pollina, Gwenyth Allegra, MD  Molnupiravir 200 MG CAPS Take 4 capsules by mouth 2 (two) times daily. 09/21/20   [provider]  oxyCODONE-acetaminophen (PERCOCET) 5-325 MG tablet Take 1 tablet by mouth every 6 (six) hours as needed. 10/25/20   Orpah Greek,  MD  Polyethyl Glycol-Propyl Glycol 0.4-0.3 % SOLN Place 1 drop into both eyes daily as needed (for dry eyes).    [provider]  tiZANidine (ZANAFLEX) 2 MG tablet Take 1 tablet (2 mg total) by mouth every 6 (six) hours as needed. 03/30/18   Leighton Parody, PA-C    Allergies    Lipitor [atorvastatin calcium]  Review of Systems   Review of Systems  Constitutional:  Negative for fever.  HENT:  Negative for ear pain and sore throat.   Eyes:  Negative for visual disturbance.  Respiratory:  Negative for cough and shortness of breath.   Cardiovascular:  Negative for chest pain.  Gastrointestinal:  Positive for diarrhea. Negative for abdominal pain, blood in stool, constipation, nausea and vomiting.  Genitourinary:  Negative for dysuria and hematuria.  Musculoskeletal:  Negative for back pain.  Skin:  Negative for rash.  Neurological:  Negative for headaches.  All other systems reviewed and are negative.  Physical Exam Updated Vital Signs BP (!) 174/78 (BP Location: Right Arm)   Pulse 72   Temp 98.3 F (36.8 C)   Resp 16   Ht 5\' 10"  (1.778 m)   Wt 92.5 kg   SpO2 97%   BMI 29.27 kg/m   Physical Exam Vitals and nursing note reviewed.  Constitutional:      Appearance: He is well-developed.  HENT:     Head: Normocephalic and atraumatic.  Eyes:     Conjunctiva/sclera: Conjunctivae normal.  Cardiovascular:     Rate and Rhythm: Normal rate and regular rhythm.     Heart sounds: Normal heart sounds. No murmur heard. Pulmonary:     Effort: Pulmonary effort is normal. No respiratory distress.     Breath sounds: Normal breath sounds. No wheezing, rhonchi or rales.  Abdominal:     General: Bowel sounds are normal.     Palpations: Abdomen is soft.     Tenderness: There is no abdominal tenderness. There is no guarding or rebound.  Musculoskeletal:     Cervical back: Neck supple.  Skin:    General: Skin is warm and dry.  Neurological:     Mental Status: He is alert.     ED Results / Procedures / Treatments   Labs (all labs ordered are listed, but only abnormal results are displayed) Labs Reviewed  COMPREHENSIVE METABOLIC PANEL - Abnormal; Notable for the following components:      Result Value   Glucose, Bld 125 (*)    All other components within normal limits  CBC - Abnormal; Notable for the following components:   Platelets 148 (*)    All other components within normal limits  C  DIFFICILE QUICK SCREEN W PCR REFLEX    LIPASE, BLOOD  URINALYSIS, ROUTINE W REFLEX MICROSCOPIC    EKG None  Radiology CT ABDOMEN PELVIS W CONTRAST  Result Date: 03/25/2021 CLINICAL DATA:  Infectious gastroenteritis or colitis; diarrhea EXAM: CT ABDOMEN AND PELVIS WITH CONTRAST TECHNIQUE: Multidetector CT imaging of the abdomen and pelvis was performed using the standard protocol following bolus administration of intravenous contrast. CONTRAST:  110mL OMNIPAQUE IOHEXOL 300 MG/ML  SOLN COMPARISON:  None. FINDINGS: Lower chest: No acute abnormality. Hepatobiliary: No focal liver abnormality. Gallbladder is contracted. No biliary dilatation. Pancreas: Fat replacement.  Otherwise unremarkable. Spleen: Unremarkable. Adrenals/Urinary Tract: Adrenals are unremarkable. Multiple nonobstructing right renal calculi measuring to 4 mm. Ureters are mildly prominent, right greater than left, without calculus. Bladder is partially distended. There are two 4 mm foci of calcification layering dependently within the bladder near the left ureteral orifice. Stomach/Bowel: Stomach is within normal limits. Bowel is normal in caliber. Distal colonic diverticulosis. Vascular/Lymphatic: Aortic atherosclerosis.  No enlarged nodes. Reproductive: Streak artifact is present. Prostate is unremarkable apart from central calcifications. Other: No free fluid.  Abdominal wall is unremarkable. Musculoskeletal: Degenerative changes of the included spine. Right hip arthroplasty with streak artifact. IMPRESSION: No  acute abnormality. Multiple nonobstructing right renal calculi. Two 4 mm foci of calcification layering dependently within the bladder may reflect recently passed stones. Distal colonic diverticulosis. Electronically Signed   By: Macy Mis M.D.   On: 03/25/2021 18:37    Procedures Procedures   Medications Ordered in ED Medications  sodium chloride 0.9 % bolus 1,000 mL (0 mLs Intravenous Stopped 03/25/21 1906)  iohexol (OMNIPAQUE) 300 MG/ML solution 100 mL (100 mLs Intravenous Contrast Given 03/25/21 1752)    ED Course  I have reviewed the triage vital signs and the nursing notes.  Pertinent labs & imaging results that were available during my care of the patient were reviewed by me and considered in my medical decision making (see chart for details).    MDM Rules/Calculators/A&P                          87 year old male presents to the emergency department today for evaluation of diarrhea that started last night.  He has had greater than 10 episodes since his symptoms started.  He denies any abdominal pain nausea or vomiting.  Reviewed/interpreted labs CBC is without leukocytosis or anemia, he does have a mild thrombocytopenia which appears chronic CMP is without any electrolyte derangement, abnormal liver or kidney function Lipase is negative UA is without signs of infection C. difficile pending at the time of dispo  Reviewed/interpreted imaging CT abd/pelvis - No acute abnormality.Multiple nonobstructing right renal calculi. Two 4 mm foci of calcification layering dependently within the bladder may reflect recently passed stones. Distal colonic diverticulosis.  Patient received IV fluids in the emergency department and was later able to tolerate p.o.  His work-up here is very reassuring.  I suspect that he has an uncomplicated diarrheal illness that will resolve with supportive care and do not feel that antibiotics are currently indicated given he has only had 24 hours of  symptoms.  He is advised to adhere to the brat diet, stay well-hydrated and follow-up closely with his PCP.  I did check a C. difficile test unfortunately this will not result until tomorrow.  I will contact the patient if positive and let him know of the results.  He is advised to follow-up with his PCP and return  to the ED for new or worsening symptoms.  He voices understanding of the plan and reasons to return.  All questions answered.  Patient stable for discharge.  3:31 PM 03/26/2021 - cdif neg   Final Clinical Impression(s) / ED Diagnoses Final diagnoses:  Diarrhea, unspecified type    Rx / DC Orders ED Discharge Orders     None        Rodney Booze, PA-C 03/26/21 1531    Wyvonnia Dusky, MD 03/28/21 [phone]

## 2021-04-02 ENCOUNTER — Other Ambulatory Visit (HOSPITAL_BASED_OUTPATIENT_CLINIC_OR_DEPARTMENT_OTHER): Payer: Self-pay

## 2021-04-19 DIAGNOSIS — Z471 Aftercare following joint replacement surgery: Secondary | ICD-10-CM | POA: Diagnosis not present

## 2021-04-19 DIAGNOSIS — Z96641 Presence of right artificial hip joint: Secondary | ICD-10-CM | POA: Diagnosis not present

## 2021-04-20 ENCOUNTER — Other Ambulatory Visit (HOSPITAL_BASED_OUTPATIENT_CLINIC_OR_DEPARTMENT_OTHER): Payer: Self-pay

## 2021-04-20 ENCOUNTER — Other Ambulatory Visit: Payer: Self-pay

## 2021-04-20 ENCOUNTER — Ambulatory Visit: Payer: Self-pay | Attending: Internal Medicine

## 2021-04-20 DIAGNOSIS — Z23 Encounter for immunization: Secondary | ICD-10-CM

## 2021-04-20 MED ORDER — PFIZER COVID-19 VAC BIVALENT 30 MCG/0.3ML IM SUSP
INTRAMUSCULAR | 0 refills | Status: AC
  Filled 2021-04-20: qty 0.3, 1d supply, fill #0

## 2021-04-20 NOTE — Progress Notes (Signed)
   Covid-19 Vaccination Clinic  Name:  Roger Saunders    MRN: [id number] DOB: 21-Nov-1931  04/20/2021  Mr. Roger Saunders was observed post Covid-19 immunization for 15 minutes without incident. He was provided with Vaccine Information Sheet and instruction to access the V-Safe system.   Mr. Roger Saunders was instructed to call 911 with any severe reactions post vaccine: Difficulty breathing  Swelling of face and throat  A fast heartbeat  A bad rash all over body  Dizziness and weakness   Immunizations Administered     Name Date Dose VIS Date Route   Pfizer Covid-19 Vaccine Bivalent Booster 04/20/2021  1:36 PM 0.3 mL 02/07/2021 Intramuscular   Manufacturer: [place]   Lot: [id number]   [place]: [id number]

## 2021-05-16 DIAGNOSIS — M25552 Pain in left hip: Secondary | ICD-10-CM | POA: Diagnosis not present

## 2021-05-29 DIAGNOSIS — E559 Vitamin D deficiency, unspecified: Secondary | ICD-10-CM | POA: Diagnosis not present

## 2021-05-29 DIAGNOSIS — R7303 Prediabetes: Secondary | ICD-10-CM | POA: Diagnosis not present

## 2021-05-29 DIAGNOSIS — E7800 Pure hypercholesterolemia, unspecified: Secondary | ICD-10-CM | POA: Diagnosis not present

## 2021-05-29 DIAGNOSIS — Z125 Encounter for screening for malignant neoplasm of prostate: Secondary | ICD-10-CM | POA: Diagnosis not present

## 2021-06-05 DIAGNOSIS — I10 Essential (primary) hypertension: Secondary | ICD-10-CM | POA: Diagnosis not present

## 2021-06-05 DIAGNOSIS — E7800 Pure hypercholesterolemia, unspecified: Secondary | ICD-10-CM | POA: Diagnosis not present

## 2021-06-05 DIAGNOSIS — R7303 Prediabetes: Secondary | ICD-10-CM | POA: Diagnosis not present

## 2021-06-05 DIAGNOSIS — K219 Gastro-esophageal reflux disease without esophagitis: Secondary | ICD-10-CM | POA: Diagnosis not present

## 2021-06-05 DIAGNOSIS — Z0000 Encounter for general adult medical examination without abnormal findings: Secondary | ICD-10-CM | POA: Diagnosis not present

## 2021-06-05 DIAGNOSIS — M1991 Primary osteoarthritis, unspecified site: Secondary | ICD-10-CM | POA: Diagnosis not present

## 2021-06-05 DIAGNOSIS — E559 Vitamin D deficiency, unspecified: Secondary | ICD-10-CM | POA: Diagnosis not present

## 2021-07-02 DIAGNOSIS — M79671 Pain in right foot: Secondary | ICD-10-CM | POA: Diagnosis not present

## 2021-07-02 DIAGNOSIS — G629 Polyneuropathy, unspecified: Secondary | ICD-10-CM | POA: Diagnosis not present

## 2021-07-02 DIAGNOSIS — M79672 Pain in left foot: Secondary | ICD-10-CM | POA: Diagnosis not present

## 2021-07-09 DIAGNOSIS — G629 Polyneuropathy, unspecified: Secondary | ICD-10-CM | POA: Diagnosis not present

## 2021-07-09 DIAGNOSIS — M79671 Pain in right foot: Secondary | ICD-10-CM | POA: Diagnosis not present

## 2021-07-09 DIAGNOSIS — M79672 Pain in left foot: Secondary | ICD-10-CM | POA: Diagnosis not present

## 2021-07-13 ENCOUNTER — Ambulatory Visit: Payer: Medicare HMO | Admitting: Podiatry

## 2021-07-13 ENCOUNTER — Other Ambulatory Visit: Payer: Self-pay

## 2021-07-13 ENCOUNTER — Ambulatory Visit: Payer: Medicare HMO

## 2021-07-13 ENCOUNTER — Encounter: Payer: Self-pay | Admitting: Podiatry

## 2021-07-13 DIAGNOSIS — M722 Plantar fascial fibromatosis: Secondary | ICD-10-CM | POA: Diagnosis not present

## 2021-07-13 DIAGNOSIS — M79671 Pain in right foot: Secondary | ICD-10-CM

## 2021-07-13 DIAGNOSIS — M79672 Pain in left foot: Secondary | ICD-10-CM

## 2021-07-13 MED ORDER — TRIAMCINOLONE ACETONIDE 10 MG/ML IJ SUSP
20.0000 mg | Freq: Once | INTRAMUSCULAR | Status: AC
  Administered 2021-07-13: 20 mg

## 2021-07-13 NOTE — Progress Notes (Signed)
Subjective:   Patient ID: Roger Saunders, male   DOB: 88 y.o.   MRN: [id number]   HPI Patient presents stating he is developed a lot of pain in the heel region of both feet and states its been really bothering him.  States that his toes also can bother him not to the same degree and seem worse at nighttime.  Patient does not smoke currently likes to be active   Review of Systems  All other systems reviewed and are negative.      Objective:  Physical Exam Vitals and nursing note reviewed.  Constitutional:      Appearance: He is well-developed.  Pulmonary:     Effort: Pulmonary effort is normal.  Musculoskeletal:        General: Normal range of motion.  Skin:    General: Skin is warm.  Neurological:     Mental Status: He is alert.    Neurovascular status found to be intact muscle strength was found to be adequate range of motion adequate.  Patient is noted to have exquisite discomfort in the medial fascial band of the heel both feet with fluid buildup localized mild forefoot discomfort but not to the same degree     Assessment:  Acute plantar fasciitis bilateral with inflammation fluid along with mild forefoot inflammation     Plan:  H&P reviewed condition and went ahead today did sterile prep and injected the plantar fascial bilateral 3 mg Kenalog 5 mg Xylocaine advised on reduced activity reappoint as symptoms indicate hopefully this will help him for extended period of time

## 2021-07-25 DIAGNOSIS — H0102A Squamous blepharitis right eye, upper and lower eyelids: Secondary | ICD-10-CM | POA: Diagnosis not present

## 2021-07-25 DIAGNOSIS — D23111 Other benign neoplasm of skin of right upper eyelid, including canthus: Secondary | ICD-10-CM | POA: Diagnosis not present

## 2021-07-25 DIAGNOSIS — Z961 Presence of intraocular lens: Secondary | ICD-10-CM | POA: Diagnosis not present

## 2021-07-25 DIAGNOSIS — H43813 Vitreous degeneration, bilateral: Secondary | ICD-10-CM | POA: Diagnosis not present

## 2021-08-10 ENCOUNTER — Ambulatory Visit: Payer: Medicare HMO | Admitting: Podiatry

## 2021-08-10 ENCOUNTER — Encounter: Payer: Self-pay | Admitting: Podiatry

## 2021-08-10 ENCOUNTER — Other Ambulatory Visit: Payer: Self-pay

## 2021-08-10 DIAGNOSIS — M722 Plantar fascial fibromatosis: Secondary | ICD-10-CM

## 2021-08-10 DIAGNOSIS — M7661 Achilles tendinitis, right leg: Secondary | ICD-10-CM

## 2021-08-10 MED ORDER — TRIAMCINOLONE ACETONIDE 10 MG/ML IJ SUSP
10.0000 mg | Freq: Once | INTRAMUSCULAR | Status: AC
  Administered 2021-08-10: 10 mg

## 2021-08-12 NOTE — Progress Notes (Signed)
Subjective:  ? ?Patient ID: Lucretia Roers, male   DOB: 88 y.o.   MRN: [id number]  ? ?HPI ?Patient states that her left heel is doing very well but his right heel has still been very tender ? ? ?ROS ? ? ?   ?Objective:  ?Physical Exam  ?Neurovascular status was found to be intact with patient found to have acute discomfort still in the right plantar fascia ? ?   ?Assessment:  ?Acute plantar fasciitis right with left improved ? ?   ?Plan:  ?Sterile prep injected the fascia right 3 mg Kenalog 5 mg Xylocaine applied sterile dressing continue with support shoes and stretch reappoint if symptoms persist ?   ? ? ?

## 2021-10-02 DIAGNOSIS — H524 Presbyopia: Secondary | ICD-10-CM | POA: Diagnosis not present

## 2021-10-02 DIAGNOSIS — Z0100 Encounter for examination of eyes and vision without abnormal findings: Secondary | ICD-10-CM | POA: Diagnosis not present

## 2021-10-18 ENCOUNTER — Ambulatory Visit: Payer: Medicare HMO | Admitting: Podiatry

## 2021-10-18 DIAGNOSIS — M7661 Achilles tendinitis, right leg: Secondary | ICD-10-CM

## 2021-10-18 MED ORDER — TRIAMCINOLONE ACETONIDE 10 MG/ML IJ SUSP
10.0000 mg | Freq: Once | INTRAMUSCULAR | Status: AC
Start: 2021-10-18 — End: 2021-10-18
  Administered 2021-10-18: 10 mg

## 2021-10-19 NOTE — Progress Notes (Signed)
Subjective:  ? ?Patient ID: Roger Saunders, male   DOB: 88 y.o.   MRN: [id number]  ? ?HPI ?Patient presents stating that the injection in the bottom of the heel was quite effective at last visit but the back of the heel has started to get sore and is irritated.  States that he would like to try to treat that the way we treated the bottom ? ? ?ROS ? ? ?   ?Objective:  ?Physical Exam  ?Neurovascular status intact with inflammation of the posterior heel on the lateral side.  The plantar fascia looks good at this point with minimal discomfort upon pressure ? ?   ?Assessment:  ?Achilles tendinitis right lateral side no medial or central involvement with inflammation of the plantar fascia which appears to be resolving ? ?   ?Plan:  ?Reviewed condition and did explain to him risk associated with injection of the posterior heel.  He is willing to accept this completely understands risk and today I did a very careful injection of the lateral side 3 mg dexamethasone Kenalog 5 mg Xylocaine advised on reduced activity heel lift usage and ice and patient will be seen back to recheck if any issues were to ?   ? ? ?

## 2021-10-21 DIAGNOSIS — I10 Essential (primary) hypertension: Secondary | ICD-10-CM | POA: Diagnosis not present

## 2021-10-21 DIAGNOSIS — G629 Polyneuropathy, unspecified: Secondary | ICD-10-CM | POA: Diagnosis not present

## 2021-10-21 DIAGNOSIS — Z809 Family history of malignant neoplasm, unspecified: Secondary | ICD-10-CM | POA: Diagnosis not present

## 2021-10-21 DIAGNOSIS — N529 Male erectile dysfunction, unspecified: Secondary | ICD-10-CM | POA: Diagnosis not present

## 2021-10-21 DIAGNOSIS — R32 Unspecified urinary incontinence: Secondary | ICD-10-CM | POA: Diagnosis not present

## 2021-10-21 DIAGNOSIS — M4800 Spinal stenosis, site unspecified: Secondary | ICD-10-CM | POA: Diagnosis not present

## 2021-10-21 DIAGNOSIS — Z96649 Presence of unspecified artificial hip joint: Secondary | ICD-10-CM | POA: Diagnosis not present

## 2021-10-21 DIAGNOSIS — M1990 Unspecified osteoarthritis, unspecified site: Secondary | ICD-10-CM | POA: Diagnosis not present

## 2021-10-21 DIAGNOSIS — G2581 Restless legs syndrome: Secondary | ICD-10-CM | POA: Diagnosis not present

## 2021-10-21 DIAGNOSIS — E785 Hyperlipidemia, unspecified: Secondary | ICD-10-CM | POA: Diagnosis not present

## 2021-10-21 DIAGNOSIS — I700 Atherosclerosis of aorta: Secondary | ICD-10-CM | POA: Diagnosis not present

## 2021-10-21 DIAGNOSIS — Z87891 Personal history of nicotine dependence: Secondary | ICD-10-CM | POA: Diagnosis not present

## 2021-11-26 DIAGNOSIS — S30860A Insect bite (nonvenomous) of lower back and pelvis, initial encounter: Secondary | ICD-10-CM | POA: Diagnosis not present

## 2021-11-26 DIAGNOSIS — W57XXXA Bitten or stung by nonvenomous insect and other nonvenomous arthropods, initial encounter: Secondary | ICD-10-CM | POA: Diagnosis not present

## 2021-12-27 ENCOUNTER — Encounter: Payer: Self-pay | Admitting: Podiatry

## 2021-12-27 ENCOUNTER — Ambulatory Visit: Payer: Medicare HMO | Admitting: Podiatry

## 2021-12-27 DIAGNOSIS — M722 Plantar fascial fibromatosis: Secondary | ICD-10-CM | POA: Diagnosis not present

## 2021-12-27 DIAGNOSIS — M7661 Achilles tendinitis, right leg: Secondary | ICD-10-CM | POA: Diagnosis not present

## 2021-12-28 NOTE — Progress Notes (Signed)
Subjective:   Patient ID: Roger Saunders, male   DOB: 88 y.o.   MRN: [id number]   HPI Patient states that his heel has started to hurt again and the back in the bottom is doing well but the back is been sore   ROS      Objective:  Physical Exam  Neurovascular status intact with inflammation of the posterior heel right moderate in its intensity not as severe as it was previously     Assessment:  Achilles tendinitis right with inflammation present     Plan:  Reviewed condition discussed treatment options including PRP injection shockwave and at this point were just can hold off he does not want to pursue that option may in the future and if we can make it another few months we may consider 1 more cortisone injection but I would like to go a longer period of time.  Patient is to be seen back as symptoms do over the next couple weeks and will try to wear shoes without backs at this time

## 2022-04-03 ENCOUNTER — Other Ambulatory Visit (HOSPITAL_BASED_OUTPATIENT_CLINIC_OR_DEPARTMENT_OTHER): Payer: Self-pay

## 2022-04-03 MED ORDER — AREXVY 120 MCG/0.5ML IM SUSR
INTRAMUSCULAR | 0 refills | Status: AC
  Filled 2022-04-03: qty 0.5, 1d supply, fill #0

## 2022-08-21 ENCOUNTER — Other Ambulatory Visit: Payer: Self-pay

## 2022-08-21 ENCOUNTER — Encounter (HOSPITAL_BASED_OUTPATIENT_CLINIC_OR_DEPARTMENT_OTHER): Payer: Self-pay | Admitting: *Deleted

## 2022-08-21 ENCOUNTER — Emergency Department (HOSPITAL_BASED_OUTPATIENT_CLINIC_OR_DEPARTMENT_OTHER)
Admission: EM | Admit: 2022-08-21 | Discharge: 2022-08-21 | Discharge status: Against Medical Advice | Payer: Medicare HMO | Attending: Emergency Medicine | Admitting: Emergency Medicine

## 2022-08-21 DIAGNOSIS — Z5321 Procedure and treatment not carried out due to patient leaving prior to being seen by health care provider: Secondary | ICD-10-CM | POA: Insufficient documentation

## 2022-08-21 DIAGNOSIS — R42 Dizziness and giddiness: Secondary | ICD-10-CM | POA: Diagnosis not present

## 2022-08-21 LAB — CBC WITH DIFFERENTIAL/PLATELET
Abs Immature Granulocytes: 0.01 10*3/uL (ref 0.00–0.07)
Basophils Absolute: 0 10*3/uL (ref 0.0–0.1)
Basophils Relative: 0 %
Eosinophils Absolute: 0.1 10*3/uL (ref 0.0–0.5)
Eosinophils Relative: 3 %
HCT: 40.6 % (ref 39.0–52.0)
Hemoglobin: 13.5 g/dL (ref 13.0–17.0)
Immature Granulocytes: 0 %
Lymphocytes Relative: 31 %
Lymphs Abs: 1.7 10*3/uL (ref 0.7–4.0)
MCH: 33.5 pg (ref 26.0–34.0)
MCHC: 33.3 g/dL (ref 30.0–36.0)
MCV: 100.7 fL — ABNORMAL HIGH (ref 80.0–100.0)
Monocytes Absolute: 0.5 10*3/uL (ref 0.1–1.0)
Monocytes Relative: 10 %
Neutro Abs: 3.1 10*3/uL (ref 1.7–7.7)
Neutrophils Relative %: 56 %
Platelets: 149 10*3/uL — ABNORMAL LOW (ref 150–400)
RBC: 4.03 MIL/uL — ABNORMAL LOW (ref 4.22–5.81)
RDW: 11.6 % (ref 11.5–15.5)
WBC: 5.5 10*3/uL (ref 4.0–10.5)
nRBC: 0 % (ref 0.0–0.2)

## 2022-08-21 LAB — BASIC METABOLIC PANEL
Anion gap: 6 (ref 5–15)
BUN: 23 mg/dL (ref 8–23)
CO2: 30 mmol/L (ref 22–32)
Calcium: 9.7 mg/dL (ref 8.9–10.3)
Chloride: 105 mmol/L (ref 98–111)
Creatinine, Ser: 0.82 mg/dL (ref 0.61–1.24)
GFR, Estimated: >60 mL/min (ref >60)
Glucose, Bld: 117 mg/dL — ABNORMAL HIGH (ref 70–99)
Potassium: 3.9 mmol/L (ref 3.5–5.1)
Sodium: 141 mmol/L (ref 135–145)

## 2022-08-21 NOTE — ED Triage Notes (Signed)
Pt has been feeling "lousy" and "dizzy when I wake up in the am".  He was seen by his PCP and she changed his BP meds.  He has been checking his BP at home and it was "well over 200".  Pt denies any CP or sob.  Pt had a covid booster on 3/7 and he began feeling lousy 8th or 9th.

## 2022-08-21 NOTE — ED Provider Triage Note (Cosign Needed)
Emergency Medicine Provider Triage Evaluation Note  Roger Saunders , a 89 y.o. male  was evaluated in triage.  Pt complains of feeling lightheaded X about 5 days, seen by PCP 2 days ago who d/c'd 2 blood pressure medications but pt feels no better, and BP was systolic [id number] at home today with his wrist monitor.  Review of Systems  Positive: dizziness Negative: Chest pain, SOB, headache, syncope, vertigo  Physical Exam  BP (!) 148/84 (BP Location: Left Arm)   Pulse 60   Temp 98.2 F (36.8 C)   Resp 16   SpO2 98%  Gen:   Awake, no distress   Resp:  Normal effort  MSK:   Moves extremities without difficulty  Other:    Medical Decision Making  Medically screening exam initiated at 5:52 PM.  Appropriate orders placed.  Roger Saunders was informed that the remainder of the evaluation will be completed by another provider, this initial triage assessment does not replace that evaluation, and the importance of remaining in the ED until their evaluation is complete.     Gwenevere Abbot, [place] 08/21/22 1753

## 2022-08-21 NOTE — ED Notes (Addendum)
Patient stating that he is wanting to leave because "he's been waiting too long."  Patient refused to wait for provider to come speak to him and refused to sign paperwork. Patient's IV removed and patient educated on risks of leaving.  Patient verbalized understanding and given return precautions.

## 2022-09-04 NOTE — Therapy (Signed)
OUTPATIENT PHYSICAL THERAPY VESTIBULAR EVALUATION     Patient Name: Roger Saunders MRN: [id number] DOB:10-24-1931, 89 y.o., male Today's Date: 09/05/2022  END OF SESSION:  PT End of Session - 09/05/22 1208     Visit Number 1    Number of Visits 13    Date for PT Re-Evaluation 10/17/22    Authorization Type [employer]    PT Start Time 0932    PT Stop Time 1009    PT Time Calculation (min) 37 min    Activity Tolerance Patient tolerated treatment well    Behavior During Therapy WFL for tasks assessed/performed             Past Medical History:  Diagnosis Date   Arthritis    Conjunctivitis, chronic    Depression    situational   GERD (gastroesophageal reflux disease)    occasionally   History of kidney stones    Hypertension    Past Surgical History:  Procedure Laterality Date   APPENDECTOMY  1938   COLONOSCOPY     HIP PINNING,CANNULATED Right 07/02/2016   Procedure: CANNULATED HIP PINNING;  Surgeon: Renette Butters, MD;  Location: [facility];  Service: Orthopedics;  Laterality: Right;   KIDNEY STONE SURGERY     TOTAL HIP ARTHROPLASTY Right 07/30/2016   Procedure: RIGHT HEMI HIP ARTHROPLASTY;  Surgeon: Renette Butters, MD;  Location: [facility];  Service: Orthopedics;  Laterality: Right;   TOTAL HIP REVISION Right 03/30/2018   Procedure: RIGHT TOTAL HIP REVISION POSTERIOR APPROACH;  Surgeon: Frederik Pear, MD;  Location: [facility];  Service: Orthopedics;  Laterality: Right;   Patient Active Problem List   Diagnosis Date Noted   History of COVID-19 10/10/2020   COVID-19 09/26/2020   Primary osteoarthritis of right hip 03/30/2018   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 MD thought it was d/t his BP so he was taken off one of his BP meds but it had no effect. Episodes are described as lightheaded, off balance and last minutes. Worse with turning, getting out of bed, bending forward. Denies head trauma, infection/illness, vision changes/double vision, hearing loss, tinnitus, migraines. Reports some "wooshing" in B ears at baseline. Walking 1.5 miles/day.  Pt accompanied by: self  PERTINENT HISTORY: Depression, GERD, HTN, R hip pinning & hemi hip replacement 2018 with revision in 2019  PAIN:  Are you having pain? No  PRECAUTIONS: Fall  WEIGHT BEARING RESTRICTIONS: No  FALLS: Has patient fallen in last 6 months? No  LIVING ENVIRONMENT: Lives with:  friend Lives in: House/apartment Stairs:  3 steps with handrail on R; 2 story house with handrail on L Has following equipment at home: Single point cane, Walker - 2 wheeled, and Grab bars  PLOF: Independent  PATIENT GOALS: improve dizziness   OBJECTIVE:   DIAGNOSTIC FINDINGS: none recent  COGNITION: Overall cognitive status: Within functional limits for tasks assessed  SENSATION: WFL   POSTURE:  rounded shoulders, forward head, and increased thoracic kyphosis    LOWER EXTREMITY MMT:   MMT Right eval Left eval  Hip flexion    Hip abduction    Hip adduction    Hip internal rotation    Hip external rotation    Knee flexion    Knee extension    Ankle dorsiflexion    Ankle plantarflexion    Ankle inversion    Ankle eversion    (Blank rows = not tested)   GAIT: Gait pattern: Short shuffling steps with B knees flexed; veering to R side and unsteady  Assistive device utilized:  None Level of assistance: SBA   PATIENT SURVEYS:  FOTO next session  VESTIBULAR ASSESSMENT:  GENERAL OBSERVATION: pt wears bifocals   OCULOMOTOR EXAM:  Ocular Alignment: normal  Ocular ROM: No Limitations  Spontaneous Nystagmus: absent  Gaze-Induced Nystagmus: absent  Smooth Pursuits: intact  Saccades: intact  VESTIBULAR - OCULAR REFLEX:   Slow VOR: Comment: slow and some difficulty coordinating horizontal direction and c/o feeling "off"; vertical intact and asymptomatic   VOR Cancellation: Normal; c/o woozy  Head-Impulse Test: HIT Right: positive HIT Left: positive *significant muscle guarding; hard to discern   POSITIONAL TESTING: Right Roll Test: negative Left Roll Test: negative  Right Sidelying: negative; pt c/o lightheadedness that worsened the longer position was held; dizziness upon sitting up Left Sidelying: negative; c/o less severe dizziness; dizziness upon sitting up  Right Dix-Hallpike: negative; no dizziness  MOTION SENSITIVITY:  Motion Sensitivity Quotient Intensity: 0 = none, 1 = Lightheaded, 2 = Mild, 3 = Moderate, 4 = Severe, 5 = Vomiting  Intensity  1. Sitting to supine   2. Supine to L side   3. Supine to R side   4. Supine to sitting   5. L Hallpike-Dix   6. Up from L    7. R Hallpike-Dix   8. Up from R    9. Sitting, head tipped to L knee   10. Head up from L knee   11. Sitting, head tipped to R knee   12. Head up from R knee   13. Sitting head turns x5   14.Sitting head nods x5   15. In stance, 180 turn to L    16. In stance, 180 turn to R      VESTIBULAR TREATMENT:                                                                                                   DATE: 09/05/22    PATIENT EDUCATION: Education details: prognosis, POC, HEP; recommended use of cane d/t imbalance Person educated: Patient Education method: Explanation, Demonstration, Tactile cues, Verbal cues, and Handouts Education comprehension: verbalized  understanding and returned demonstration  HOME EXERCISE PROGRAM: Access Code: [email or web address] URL: [email or web address] Date: 09/05/2022 Prepared by: [facility]  Exercises - Seated Gaze Stabilization with Head Nod  - 1 x daily - 5 x weekly - 2-3 sets - 30 sec hold - Seated Gaze Stabilization with Head Rotation  -  1 x daily - 5 x weekly - 2-3 sets - 30 sec hold - Brandt-Daroff Vestibular Exercise  - 1 x daily - 5 x weekly - 2 sets - 3-5 reps  GOALS: Goals reviewed with patient? Yes  SHORT TERM GOALS: Target date: 09/26/2022  Patient to be independent with initial HEP. Baseline: HEP initiated Goal status: INITIAL    LONG TERM GOALS: Target date: 10/17/2022  Patient to be independent with advanced HEP. Baseline: Not yet initiated  Goal status: INITIAL  Patient to report 0/10 dizziness with standing vertical and horizontal VOR for 30 seconds. Baseline: Unable Goal status: INITIAL  Patient will report 0/10 dizziness with bed mobility.  Baseline: Symptomatic  Goal status: INITIAL  Patient to demonstrate mild-moderate sway with M-CTSIB condition with eyes closed/foam surface in order to improve safety in environments with uneven surfaces and dim lighting. Baseline: NT Goal status: INITIAL  Patient to score at least 46/56 on Berg in order to decrease risk of falls. Baseline: NT Goal status: INITIAL  Patient will ambulate over outdoor surfaces with LRAD while performing head turns to scan environment with good stability in order to indicate safe community mobility. Baseline: Unable Goal status: INITIAL  Patient to score at least x on FOTO in order to indicate improved functional outcomes.  Baseline: NT Goal status: INITIAL   ASSESSMENT:  CLINICAL IMPRESSION:  Patient is an 89 y/o M presenting to OPPT with c/o dizziness for the past 2 weeks. Episodes are described as "lightheaded, off balance" and last minutes. Worse with  turning, getting out of bed, bending forward. Denies head trauma, infection/illness, vision changes/double vision, hearing loss, tinnitus, migraines. Reports some "wooshing" in B ears at baseline. Patient today presenting with Dizziness with horizontal VOR and VOR cancellation, positive B HIT, imbalance, and gait deviations. Patient with c/o dizziness with R/L sidelying test but positional testing negative. Patient was educated on gentle VOR and habituation HEP and reported understanding. Would benefit from skilled PT services 2 x/week for 6 weeks to address aforementioned impairments in order to optimize level of function.    OBJECTIVE IMPAIRMENTS: Abnormal gait, decreased balance, decreased safety awareness, dizziness, and postural dysfunction.   ACTIVITY LIMITATIONS: carrying, lifting, bending, standing, squatting, stairs, transfers, bed mobility, and bathing  PARTICIPATION LIMITATIONS: meal prep, cleaning, laundry, shopping, community activity, and yard work  PERSONAL FACTORS: Age, Fitness, Past/current experiences, Time since onset of injury/illness/exacerbation, and 3+ comorbidities: Depression, GERD, HTN, R hip pinning & hemi hip replacement 2018 with revision in 2019  are also affecting patient's functional outcome.   REHAB POTENTIAL: Good  CLINICAL DECISION MAKING: Evolving/moderate complexity  EVALUATION COMPLEXITY: Moderate   PLAN:  PT FREQUENCY: 2x/week  PT DURATION: 6 weeks  PLANNED INTERVENTIONS: Therapeutic exercises, Therapeutic activity, Neuromuscular re-education, Balance training, Gait training, Patient/Family education, Self Care, Joint mobilization, Stair training, Vestibular training, Canalith repositioning, Aquatic Therapy, Dry Needling, Electrical stimulation, Cryotherapy, Moist heat, Taping, Manual therapy, and Re-evaluation  PLAN FOR NEXT SESSION: MCTSIB, Berg, FOTO, review HEP, progress gaze stabilization, balance, habituation; continue to encourage use of cane if  imbalance continues   Janene Harvey, PT, DPT 09/05/22 12:19 PM  [facility] [address], [place], [place] [address] Phone # [phone] Fax # [phone]

## 2022-09-05 ENCOUNTER — Other Ambulatory Visit: Payer: Self-pay

## 2022-09-05 ENCOUNTER — Encounter: Payer: Self-pay | Admitting: Physical Therapy

## 2022-09-05 ENCOUNTER — Ambulatory Visit: Payer: Medicare HMO | Admitting: Physical Therapy

## 2022-09-05 DIAGNOSIS — R2689 Other abnormalities of gait and mobility: Secondary | ICD-10-CM | POA: Insufficient documentation

## 2022-09-05 DIAGNOSIS — R42 Dizziness and giddiness: Secondary | ICD-10-CM | POA: Diagnosis present

## 2022-09-05 DIAGNOSIS — R2681 Unsteadiness on feet: Secondary | ICD-10-CM | POA: Diagnosis present

## 2022-09-10 ENCOUNTER — Encounter: Payer: Self-pay | Admitting: Physical Therapy

## 2022-09-10 ENCOUNTER — Ambulatory Visit: Payer: Medicare HMO | Admitting: Physical Therapy

## 2022-09-10 DIAGNOSIS — R42 Dizziness and giddiness: Secondary | ICD-10-CM

## 2022-09-10 DIAGNOSIS — R2681 Unsteadiness on feet: Secondary | ICD-10-CM

## 2022-09-10 DIAGNOSIS — R2689 Other abnormalities of gait and mobility: Secondary | ICD-10-CM | POA: Diagnosis present

## 2022-09-10 NOTE — Therapy (Signed)
OUTPATIENT PHYSICAL THERAPY VESTIBULAR TREATMENT     Patient Name: Roger Saunders MRN: [id number] DOB:[birth year], 89 y.o., male Today's Date: 09/10/2022  END OF SESSION:  PT End of Session - 09/10/22 1023     Visit Number 2    Number of Visits 13    Date for PT Re-Evaluation 10/17/22    Authorization Type [employer]    PT Start Time 1021    PT Stop Time 1100    PT Time Calculation (min) 39 min    Activity Tolerance Patient tolerated treatment well    Behavior During Therapy WFL for tasks assessed/performed             Past Medical History:  Diagnosis Date   Arthritis    Conjunctivitis, chronic    Depression    situational   GERD (gastroesophageal reflux disease)    occasionally   History of kidney stones    Hypertension    Past Surgical History:  Procedure Laterality Date   APPENDECTOMY  1938   COLONOSCOPY     HIP PINNING,CANNULATED Right 07/02/2016   Procedure: CANNULATED HIP PINNING;  Surgeon: Renette Butters, MD;  Location: [place];  Service: Orthopedics;  Laterality: Right;   KIDNEY STONE SURGERY     TOTAL HIP ARTHROPLASTY Right 07/30/2016   Procedure: RIGHT HEMI HIP ARTHROPLASTY;  Surgeon: Renette Butters, MD;  Location: [place];  Service: Orthopedics;  Laterality: Right;   TOTAL HIP REVISION Right 03/30/2018   Procedure: RIGHT TOTAL HIP REVISION POSTERIOR APPROACH;  Surgeon: Frederik Pear, MD;  Location: [facility];  Service: Orthopedics;  Laterality: Right;   Patient Active Problem List   Diagnosis Date Noted   History of COVID-19 10/10/2020   COVID-19 09/26/2020   Primary osteoarthritis of right hip 03/30/2018   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  Pt accompanied by: self  PERTINENT HISTORY: Depression, GERD, HTN, R hip pinning & hemi hip replacement 2018 with revision in 2019  PAIN:  Are you having pain? No  PRECAUTIONS: Fall  WEIGHT BEARING RESTRICTIONS: No  FALLS: Has patient fallen in last 6 months? No  LIVING ENVIRONMENT: Lives with:  friend Lives in: House/apartment Stairs:  3 steps with handrail on R; 2 story house with handrail on L Has following equipment at home: Single point cane, Walker - 2 wheeled, and Grab bars  PLOF: Independent  PATIENT GOALS: improve dizziness   OBJECTIVE:   Reviewed HEP given last visit, with pt verbalize understanding  TODAY'S TREATMENT: 09/10/2022 Activity Comments  Berg:  50/56 Scores <45/56 indicate increased fall risk  DGI 20/24 Scores <19/24 indicate increased fall risk  Progressed to standing gaze stabilization vertical and horizontal 30 seconds Minimal to no symptoms  Standing at counter on [employer]: -feet apart, EO head turns/nods x 5 reps, then EC head turns/nods x 5 reps UE support, mild unsteadiness  FOTO 48 current; predicted 64  M-CTSIB  Condition 1: Firm Surface, EO 30 Sec, Normal Sway  Condition 2: Firm Surface, EC 30 Sec, Normal Sway  Condition 3: Foam Surface, EO 30 Sec, Normal Sway  Condition 4: Foam Surface, EC 30 Sec, Mild Sway    Access Code: [email or web address] URL: [email or web address] Date: 09/10/2022 Prepared by: [facility]  Exercises - Seated Gaze Stabilization with Head Nod  - 1 x daily - 5 x weekly - 2-3 sets - 30 sec hold>PROGRESS TO STANDING 09/10/2022 - Seated Gaze Stabilization with Head  Rotation  - 1 x daily - 5 x weekly - 2-3 sets - 30 sec hold>PROGRESS TO STANDING 09/10/2022 - Brandt-Daroff Vestibular Exercise  - 1 x daily - 5 x weekly - 2 sets - 3-5 reps - Standing on Foam Pad  - 1 x daily - 5 x weekly - 1 sets - 3 reps - Standing Balance with Eyes Closed on Foam  - 1 x daily - 5 x weekly - 1 sets - 3 reps  PATIENT EDUCATION: Education details: HEP updates Person educated: Patient Education method: Explanation, Demonstration, and Handouts Education comprehension: verbalized understanding and returned demonstration  ----------------------------------------------------------------------------- Objective measures below taken at initial evaluation:  DIAGNOSTIC FINDINGS: none recent  COGNITION: Overall cognitive status: Within functional limits for tasks assessed   SENSATION: WFL   POSTURE:  rounded shoulders, forward head, and increased thoracic kyphosis    LOWER EXTREMITY MMT:   MMT Right eval Left eval  Hip flexion    Hip abduction    Hip adduction    Hip internal rotation    Hip external rotation    Knee flexion    Knee extension    Ankle dorsiflexion    Ankle plantarflexion    Ankle inversion    Ankle eversion    (Blank rows = not tested)   GAIT: Gait pattern: Short shuffling steps with B knees flexed; veering to R side and unsteady  Assistive device utilized: None Level of assistance: SBA   PATIENT SURVEYS:  FOTO next session  VESTIBULAR ASSESSMENT:  GENERAL OBSERVATION: pt wears bifocals   OCULOMOTOR EXAM:  Ocular Alignment: normal  Ocular ROM: No Limitations  Spontaneous Nystagmus: absent  Gaze-Induced Nystagmus: absent  Smooth Pursuits: intact  Saccades: intact  VESTIBULAR - OCULAR REFLEX:   Slow VOR: Comment: slow and some difficulty coordinating horizontal direction and c/o feeling "off"; vertical intact and asymptomatic   VOR Cancellation: Normal; c/o woozy  Head-Impulse Test: HIT Right: positive HIT Left:  positive *significant muscle guarding; hard to discern   POSITIONAL TESTING: Right Roll Test: negative Left Roll Test: negative  Right Sidelying: negative; pt c/o lightheadedness that worsened the longer position was held; dizziness upon sitting up Left Sidelying: negative; c/o less severe dizziness; dizziness upon sitting up  Right Dix-Hallpike: negative; no dizziness  MOTION SENSITIVITY:  Motion Sensitivity Quotient Intensity: 0 = none, 1 = Lightheaded, 2 = Mild, 3 = Moderate, 4 = Severe, 5 = Vomiting  Intensity  1. Sitting to supine   2. Supine to L side   3. Supine to R side   4. Supine to sitting   5. L Hallpike-Dix   6. Up from L    7. R Hallpike-Dix   8. Up from R    9. Sitting, head tipped to L knee   10. Head up from L knee   11. Sitting, head tipped to R knee   12. Head up from R knee   13.  Sitting head turns x5   14.Sitting head nods x5   15. In stance, 180 turn to L    16. In stance, 180 turn to R       GOALS: Goals reviewed with patient? Yes  SHORT TERM GOALS: Target date: 09/26/2022  Patient to be independent with initial HEP. Baseline: HEP initiated Goal status: INITIAL    LONG TERM GOALS: Target date: 10/17/2022  Patient to be independent with advanced HEP. Baseline: Not yet initiated  Goal status: INITIAL  Patient to report 0/10 dizziness with standing vertical and horizontal VOR for 30 seconds. Baseline: Unable Goal status: INITIAL  Patient will report 0/10 dizziness with bed mobility.  Baseline: Symptomatic  Goal status: INITIAL  Patient to demonstrate mild-moderate sway with M-CTSIB condition with eyes closed/foam surface in order to improve safety in environments with uneven surfaces and dim lighting. Baseline: NT Goal status: INITIAL  Patient to score at least 46/56 on Berg in order to decrease risk of falls. Baseline: NT 50/56 09/10/2022 Goal status: GOAL DEFERRED  Patient will ambulate over outdoor surfaces with LRAD while  performing head turns to scan environment with good stability in order to indicate safe community mobility. Baseline: Unable Goal status: INITIAL  Patient to score at least 64 on FOTO in order to indicate improved functional outcomes.  Baseline: NT Goal status: INITIAL   ASSESSMENT:  CLINICAL IMPRESSION:  Assessed balance measures today, with Berg score 50/56 and DGI score 20/24.  On MCTSIB, he has only mild unsteadiness on condition 4.  Overall, pt is reporting improved dizziness symptoms, only up to 1/10 at times with Brandt-Daroff at home.  He c/o minimal to no dizziness with standing gaze stabilization and no dizziness with seated gaze stabilization; progressed HEP to standing gaze stabilization.  Pt feels he is nearly back to normal.  Will continue to reassess and address vestibular deficits.  OBJECTIVE IMPAIRMENTS: Abnormal gait, decreased balance, decreased safety awareness, dizziness, and postural dysfunction.   ACTIVITY LIMITATIONS: carrying, lifting, bending, standing, squatting, stairs, transfers, bed mobility, and bathing  PARTICIPATION LIMITATIONS: meal prep, cleaning, laundry, shopping, community activity, and yard work  PERSONAL FACTORS: Age, Fitness, Past/current experiences, Time since onset of injury/illness/exacerbation, and 3+ comorbidities: Depression, GERD, HTN, R hip pinning & hemi hip replacement 2018 with revision in 2019  are also affecting patient's functional outcome.   REHAB POTENTIAL: Good  CLINICAL DECISION MAKING: Evolving/moderate complexity  EVALUATION COMPLEXITY: Moderate   PLAN:  PT FREQUENCY: 2x/week  PT DURATION: 6 weeks  PLANNED INTERVENTIONS: Therapeutic exercises, Therapeutic activity, Neuromuscular re-education, Balance training, Gait training, Patient/Family education, Self Care, Joint mobilization, Stair training, Vestibular training, Canalith repositioning, Aquatic Therapy, Dry Needling, Electrical stimulation, Cryotherapy, Moist heat,  Taping, Manual therapy, and Re-evaluation  PLAN FOR NEXT SESSION: Review HEP updates; progress gaze stabilization, balance, habituation; continue to encourage use of cane if imbalance continues.  Pt reports if he continues to feel good, he would like to d/c next visit.   Mady Haagensen, PT 09/10/22 11:03 AM Phone: [phone] Fax: [phone]  [facility] at [facility] [address], [place], [place] [address] Phone # [phone] Fax # [phone]

## 2022-09-12 ENCOUNTER — Ambulatory Visit: Payer: Medicare HMO | Admitting: Physical Therapy

## 2022-09-17 ENCOUNTER — Ambulatory Visit: Payer: Medicare HMO | Admitting: Physical Therapy

## 2022-09-19 ENCOUNTER — Ambulatory Visit: Payer: Medicare HMO | Admitting: Physical Therapy

## 2022-09-26 ENCOUNTER — Encounter: Payer: Medicare HMO | Admitting: Physical Therapy

## 2022-10-01 ENCOUNTER — Encounter: Payer: Medicare HMO | Admitting: Physical Therapy

## 2022-10-03 ENCOUNTER — Encounter: Payer: Medicare HMO | Admitting: Physical Therapy

## 2022-10-08 ENCOUNTER — Encounter: Payer: Medicare HMO | Admitting: Physical Therapy

## 2022-10-15 ENCOUNTER — Encounter: Payer: Medicare HMO | Admitting: Physical Therapy

## 2022-10-17 ENCOUNTER — Encounter: Payer: Medicare HMO | Admitting: Physical Therapy

## 2022-10-29 ENCOUNTER — Encounter: Payer: Self-pay | Admitting: Physical Therapy

## 2022-10-29 NOTE — Therapy (Signed)
[facility] [address], [address] [place], [place], [address] Phone: [phone]   Fax:  [phone]  Patient Details  Name: MARIE CORREA MRN: [id number] Date of Birth: 1931-12-20 Referring Provider:  No ref. provider found  Encounter Date: 10/29/2022  PHYSICAL THERAPY DISCHARGE SUMMARY  Visits from Start of Care: 2  Current functional level related to goals / functional outcomes: PT goals not fully assessed/addressed, due to pt not returning after 2nd visit.  Pt does report at second visit, no dizziness, but doesn't feel "100% right".   Remaining deficits: High level balance, vestibular system deficits   Education / Equipment: Educated in HEP   Patient agrees to discharge. Patient goals were not met. Patient is being discharged due to not returning since the last visit.   Garison Genova W., PT 10/29/2022, 12:26 PM  [facility] [address], [address] [place], [place], [address] Phone: [phone]   Fax:  [phone]

## 2023-05-27 ENCOUNTER — Other Ambulatory Visit: Payer: Self-pay | Admitting: *Deleted

## 2023-05-27 ENCOUNTER — Ambulatory Visit: Payer: Medicare HMO | Attending: Cardiology | Admitting: Cardiology

## 2023-05-27 ENCOUNTER — Encounter: Payer: Self-pay | Admitting: Cardiology

## 2023-05-27 VITALS — BP 106/60 | HR 73 | Ht 70.0 in | Wt 205.0 lb

## 2023-05-27 DIAGNOSIS — E785 Hyperlipidemia, unspecified: Secondary | ICD-10-CM

## 2023-05-27 DIAGNOSIS — I10 Essential (primary) hypertension: Secondary | ICD-10-CM | POA: Diagnosis not present

## 2023-05-27 NOTE — Progress Notes (Signed)
Cardiology Office Note:    Date:  05/27/2023   ID:  JIHO ECKART, DOB 1931-09-27, MRN [id number]  PCP:  Linus Galas, NP  Cardiologist:  None  Electrophysiologist:  None   Referring MD: Pearson Grippe, MD   Chief Complaint  Patient presents with   Hypertension    History of Present Illness:    Roger Saunders is a 89 y.o. male with a hx of hypertension, hyperlipidemia who is referred by Dr. Selena Batten for evaluation of hypertension and lightheadedness.  He reports he was taking losartan-HCTZ 100-12.5 mg daily and developed lightheadedness.  He switched to losartan 100 mg daily with amlodipine 5 to 10 mg daily as needed.  Since switching his BP medication, he denies any lightheadedness or dizziness.  Denies any chest pain, dyspnea, syncope, lower extremity edema, or palpitations.  Smoked cigars x 15 years, about 2/day, quit in early 59s.  Family history includes mother had MI in 76s.   Past Medical History:  Diagnosis Date   Arthritis    Conjunctivitis, chronic    Depression    situational   GERD (gastroesophageal reflux disease)    occasionally   History of kidney stones    Hypertension     Past Surgical History:  Procedure Laterality Date   APPENDECTOMY  1938   COLONOSCOPY     HIP PINNING,CANNULATED Right 07/02/2016   Procedure: CANNULATED HIP PINNING;  Surgeon: Sheral Apley, MD;  Location: [facility];  Service: Orthopedics;  Laterality: Right;   KIDNEY STONE SURGERY     TOTAL HIP ARTHROPLASTY Right 07/30/2016   Procedure: RIGHT HEMI HIP ARTHROPLASTY;  Surgeon: Sheral Apley, MD;  Location: [facility];  Service: Orthopedics;  Laterality: Right;   TOTAL HIP REVISION Right 03/30/2018   Procedure: RIGHT TOTAL HIP REVISION POSTERIOR APPROACH;  Surgeon: Gean Birchwood, MD;  Location: [facility];  Service: Orthopedics;  Laterality: Right;    Current Medications: Current Meds  Medication Sig   Calcium Carbonate (CALCIUM 600 PO) Take 600 mg by mouth 2 (two) times daily.   COVID-19 mRNA bivalent  vaccine, Pfizer, (PFIZER COVID-19 VAC BIVALENT) injection Inject into the muscle.   losartan (COZAAR) 100 MG tablet Take 100 mg by mouth every evening.    RSV vaccine recomb adjuvanted (AREXVY) 120 MCG/0.5ML injection Inject into the muscle.   [DISCONTINUED] amLODipine (NORVASC) 5 MG tablet Take 5 mg by mouth every evening.     Allergies:   Lipitor [atorvastatin calcium]   Social History   Socioeconomic History   Marital status: Single    Spouse name: Not on file   Number of children: Not on file   Years of education: Not on file   Highest education level: Not on file  Occupational History   Not on file  Tobacco Use   Smoking status: Former    Types: Pipe, Cigars    Quit date: 2007    Years since quitting: 17.9   Smokeless tobacco: Never  Vaping Use   Vaping status: Never Used  Substance and Sexual Activity   Alcohol use: Yes    Alcohol/week: 7.0 standard drinks of alcohol    Types: 7 Glasses of wine per week    Comment: "a litle bit of red wine"   Drug use: No   Sexual activity: Not on file  Other Topics Concern   Not on file  Social History Narrative   Not on file   Social Drivers of Health   Financial Resource Strain: Not on file  Food  Insecurity: Not on file  Transportation Needs: Not on file  Physical Activity: Not on file  Stress: Not on file  Social Connections: Not on file     Family History: The patient's family history includes Heart attack in his mother; Parkinson's disease in his brother; Prostate cancer in his father.  ROS:   Please see the history of present illness.     All other systems reviewed and are negative.  EKGs/Labs/Other Studies Reviewed:    The following studies were reviewed today:   EKG:   05/27/2023: Normal sinus rhythm heart rate 73, no ST abnormalities  Recent Labs: 08/21/2022: BUN 23; Creatinine, Ser 0.82; Hemoglobin 13.5; Platelets 149; Potassium 3.9; Sodium 141  Recent Lipid Panel No results found for: "CHOL", "TRIG",  "HDL", "CHOLHDL", "VLDL", "LDLCALC", "LDLDIRECT"  Physical Exam:    VS:  BP 106/60   Pulse 73   Ht 5\' 10"  (1.778 m)   Wt 205 lb (93 kg)   SpO2 99%   BMI 29.41 kg/m     Wt Readings from Last 3 Encounters:  05/27/23 205 lb (93 kg)  03/25/21 204 lb (92.5 kg)  10/25/20 215 lb (97.5 kg)     GEN:  Well nourished, well developed in no acute distress HEENT: Normal NECK: No JVD; No carotid bruits LYMPHATICS: No lymphadenopathy CARDIAC: RRR, no murmurs, rubs, gallops RESPIRATORY:  Clear to auscultation without rales, wheezing or rhonchi  ABDOMEN: Soft, non-tender, non-distended MUSCULOSKELETAL:  No edema; No deformity  SKIN: Warm and dry NEUROLOGIC:  Alert and oriented x 3 PSYCHIATRIC:  Normal affect   ASSESSMENT:    1. Essential hypertension   2. Hyperlipidemia, unspecified hyperlipidemia type    PLAN:     Hypertension: On losartan 100 mg daily.  Also prescribed amlodipine 5 to 10 mg daily as needed.  BP appears well-controlled.  Home BP monitor measured 30 mmHg higher than our cuff in clinic today.  Recommend obtaining new BP monitor, recommended Omron upper arm cuff.  Would continue losartan 100 mg daily.  Would not recommend taking as needed amlodipine.  Will check echocardiogram.  Hyperlipidemia: On simvastatin 10 mg every other day  RTC in 6 months  Medication Adjustments/Labs and Tests Ordered: Current medicines are reviewed at length with the patient today.  Concerns regarding medicines are outlined above.  Orders Placed This Encounter  Procedures   EKG 12-Lead   ECHOCARDIOGRAM COMPLETE   No orders of the defined types were placed in this encounter.   Patient Instructions  Medication Instructions:  Continue medications as ordered *If you need a refill on your cardiac medications before your next appointment, please call your pharmacy*   Lab Work: none If you have labs (blood work) drawn today and your tests are completely normal, you will receive your  results only by: MyChart Message (if you have MyChart) OR A paper copy in the mail If you have any lab test that is abnormal or we need to change your treatment, we will call you to review the results.   Testing/Procedures: Echo  Your physician has requested that you have an echocardiogram. Echocardiography is a painless test that uses sound waves to create images of your heart. It provides your doctor with information about the size and shape of your heart and how well your heart's chambers and valves are working. This procedure takes approximately one hour. There are no restrictions for this procedure. Please do NOT wear cologne, perfume, aftershave, or lotions (deodorant is allowed). Please arrive 15 minutes prior to  your appointment time.  Please note: We ask at that you not bring children with you during ultrasound (echo/ vascular) testing. Due to room size and safety concerns, children are not allowed in the ultrasound rooms during exams. Our front office staff cannot provide observation of children in our lobby area while testing is being conducted. An adult accompanying a patient to their appointment will only be allowed in the ultrasound room at the discretion of the ultrasound technician under special circumstances. We apologize for any inconvenience.    Follow-Up: At [facility], you and your health needs are our priority.  As part of our continuing mission to provide you with exceptional heart care, we have created designated Provider Care Teams.  These Care Teams include your primary Cardiologist (physician) and Advanced Practice Providers (APPs -  Physician Assistants and Nurse Practitioners) who all work together to provide you with the care you need, when you need it.  We recommend signing up for the patient portal called "MyChart".  Sign up information is provided on this After Visit Summary.  MyChart is used to connect with patients for Virtual Visits (Telemedicine).   Patients are able to view lab/test results, encounter notes, upcoming appointments, etc.  Non-urgent messages can be sent to your provider as well.   To learn more about what you can do with MyChart, go to [email or web address].    Your next appointment:   6 month  Provider:   Dr. Bjorn Pippin  Other Instructions Please purchase a omron blood pressure cuff and take blood pressure daily and send via mychart or call office        Signed, Little Ishikawa, MD  05/27/2023 5:12 PM    [facility]

## 2023-05-27 NOTE — Patient Instructions (Addendum)
Medication Instructions:  Continue medications as ordered *If you need a refill on your cardiac medications before your next appointment, please call your pharmacy*   Lab Work: none If you have labs (blood work) drawn today and your tests are completely normal, you will receive your results only by: MyChart Message (if you have MyChart) OR A paper copy in the mail If you have any lab test that is abnormal or we need to change your treatment, we will call you to review the results.   Testing/Procedures: Echo  Your physician has requested that you have an echocardiogram. Echocardiography is a painless test that uses sound waves to create images of your heart. It provides your doctor with information about the size and shape of your heart and how well your heart's chambers and valves are working. This procedure takes approximately one hour. There are no restrictions for this procedure. Please do NOT wear cologne, perfume, aftershave, or lotions (deodorant is allowed). Please arrive 15 minutes prior to your appointment time.  Please note: We ask at that you not bring children with you during ultrasound (echo/ vascular) testing. Due to room size and safety concerns, children are not allowed in the ultrasound rooms during exams. Our front office staff cannot provide observation of children in our lobby area while testing is being conducted. An adult accompanying a patient to their appointment will only be allowed in the ultrasound room at the discretion of the ultrasound technician under special circumstances. We apologize for any inconvenience.    Follow-Up: At [facility], you and your health needs are our priority.  As part of our continuing mission to provide you with exceptional heart care, we have created designated Provider Care Teams.  These Care Teams include your primary Cardiologist (physician) and Advanced Practice Providers (APPs -  Physician Assistants and Nurse  Practitioners) who all work together to provide you with the care you need, when you need it.  We recommend signing up for the patient portal called "MyChart".  Sign up information is provided on this After Visit Summary.  MyChart is used to connect with patients for Virtual Visits (Telemedicine).  Patients are able to view lab/test results, encounter notes, upcoming appointments, etc.  Non-urgent messages can be sent to your provider as well.   To learn more about what you can do with MyChart, go to [email or web address].    Your next appointment:   6 month  Provider:   Dr. Bjorn Pippin  Other Instructions Please purchase a omron blood pressure cuff and take blood pressure daily and send via mychart or call office

## 2023-05-30 NOTE — Addendum Note (Signed)
Addended by: Alta Corning on: 05/30/2023 11:43 AM   Modules accepted: Orders

## 2023-06-25 ENCOUNTER — Ambulatory Visit (HOSPITAL_COMMUNITY): Payer: Medicare HMO | Attending: Cardiology

## 2023-06-25 DIAGNOSIS — I10 Essential (primary) hypertension: Secondary | ICD-10-CM | POA: Insufficient documentation

## 2023-06-25 LAB — ECHOCARDIOGRAM COMPLETE
Area-P 1/2: 1.84 cm2
S' Lateral: 2.5 cm

## 2023-12-01 ENCOUNTER — Ambulatory Visit: Payer: Medicare HMO | Admitting: Cardiology
